# Patient Record
Sex: Female | Born: 1970 | Race: White | Hispanic: No | Marital: Married | Smoking: Former smoker
Health system: Southern US, Community
[De-identification: ages and names within clinical notes are randomized; demographics above are authoritative.]

## PROBLEM LIST (undated history)

## (undated) ENCOUNTER — Ambulatory Visit (HOSPITAL_BASED_OUTPATIENT_CLINIC_OR_DEPARTMENT_OTHER): Payer: Self-pay | Admitting: Gastroenterology

## (undated) DIAGNOSIS — S060XAA Concussion with loss of consciousness status unknown, initial encounter: Secondary | ICD-10-CM

## (undated) DIAGNOSIS — I10 Essential (primary) hypertension: Secondary | ICD-10-CM

## (undated) DIAGNOSIS — F319 Bipolar disorder, unspecified: Secondary | ICD-10-CM

## (undated) DIAGNOSIS — Z8619 Personal history of other infectious and parasitic diseases: Secondary | ICD-10-CM

## (undated) DIAGNOSIS — F32A Depression, unspecified: Secondary | ICD-10-CM

## (undated) DIAGNOSIS — E039 Hypothyroidism, unspecified: Secondary | ICD-10-CM

## (undated) DIAGNOSIS — R112 Nausea with vomiting, unspecified: Secondary | ICD-10-CM

## (undated) DIAGNOSIS — F419 Anxiety disorder, unspecified: Secondary | ICD-10-CM

## (undated) DIAGNOSIS — Z9889 Other specified postprocedural states: Secondary | ICD-10-CM

## (undated) DIAGNOSIS — K5100 Ulcerative (chronic) pancolitis without complications: Secondary | ICD-10-CM

## (undated) DIAGNOSIS — K6289 Other specified diseases of anus and rectum: Secondary | ICD-10-CM

## (undated) DIAGNOSIS — R197 Diarrhea, unspecified: Secondary | ICD-10-CM

## (undated) DIAGNOSIS — K529 Noninfective gastroenteritis and colitis, unspecified: Secondary | ICD-10-CM

## (undated) DIAGNOSIS — K635 Polyp of colon: Secondary | ICD-10-CM

## (undated) HISTORY — DX: Concussion with loss of consciousness status unknown, initial encounter: S06.0XAA

## (undated) HISTORY — PX: PARATHYROIDECTOMY: SHX19

## (undated) HISTORY — PX: DIAGNOSTIC LAPAROSCOPY: SUR761

## (undated) HISTORY — DX: Polyp of colon: K63.5

## (undated) HISTORY — DX: Ulcerative (chronic) pancolitis without complications: K51.00

## (undated) HISTORY — DX: Anxiety disorder, unspecified: F41.9

## (undated) HISTORY — DX: Diarrhea, unspecified: R19.7

## (undated) HISTORY — DX: Other specified diseases of anus and rectum: K62.89

---

## 1997-12-03 ENCOUNTER — Other Ambulatory Visit: Admission: RE | Admit: 1997-12-03 | Discharge: 1997-12-03 | Payer: Self-pay | Admitting: Obstetrics and Gynecology

## 1998-05-22 ENCOUNTER — Emergency Department: Admit: 1998-05-22 | Disposition: A | Discharge status: Home / Self Care | Payer: Self-pay | Source: Ambulatory Visit

## 1998-05-23 ENCOUNTER — Emergency Department: Admit: 1998-05-23 | Disposition: A | Discharge status: Home / Self Care | Payer: Self-pay | Source: Ambulatory Visit

## 1998-06-02 ENCOUNTER — Other Ambulatory Visit: Admission: RE | Admit: 1998-06-02 | Discharge: 1998-06-02 | Payer: Self-pay | Admitting: Obstetrics and Gynecology

## 1999-06-03 ENCOUNTER — Ambulatory Visit
Admission: RE | Admit: 1999-06-03 | Disposition: A | Discharge status: Home / Self Care | Payer: Self-pay | Source: Ambulatory Visit

## 1999-06-03 ENCOUNTER — Other Ambulatory Visit: Admission: RE | Admit: 1999-06-03 | Discharge: 1999-06-03 | Payer: Self-pay | Admitting: Obstetrics and Gynecology

## 1999-07-20 ENCOUNTER — Ambulatory Visit (HOSPITAL_COMMUNITY): Admission: RE | Admit: 1999-07-20 | Discharge: 1999-07-20 | Payer: Self-pay | Admitting: Obstetrics and Gynecology

## 1999-07-20 ENCOUNTER — Encounter: Payer: Self-pay | Admitting: Obstetrics and Gynecology

## 2000-05-04 ENCOUNTER — Ambulatory Visit
Admission: AD | Admit: 2000-05-04 | Disposition: A | Discharge status: Home / Self Care | Payer: Self-pay | Source: Ambulatory Visit

## 2000-05-08 ENCOUNTER — Inpatient Hospital Stay
Admission: AD | Admit: 2000-05-08 | Disposition: A | Discharge status: Home / Self Care | Payer: Self-pay | Source: Ambulatory Visit

## 2000-07-20 ENCOUNTER — Other Ambulatory Visit: Admission: RE | Admit: 2000-07-20 | Discharge: 2000-07-20 | Payer: Self-pay | Admitting: Obstetrics and Gynecology

## 2001-09-06 ENCOUNTER — Other Ambulatory Visit: Admission: RE | Admit: 2001-09-06 | Discharge: 2001-09-06 | Payer: Self-pay | Admitting: Obstetrics and Gynecology

## 2002-03-27 ENCOUNTER — Encounter: Payer: Self-pay | Admitting: Internal Medicine

## 2002-03-27 ENCOUNTER — Encounter: Admission: RE | Admit: 2002-03-27 | Discharge: 2002-03-27 | Payer: Self-pay | Admitting: Internal Medicine

## 2002-09-13 ENCOUNTER — Other Ambulatory Visit: Admission: RE | Admit: 2002-09-13 | Discharge: 2002-09-13 | Payer: Self-pay | Admitting: Obstetrics and Gynecology

## 2002-11-15 ENCOUNTER — Encounter: Admission: RE | Admit: 2002-11-15 | Discharge: 2002-11-15 | Payer: Self-pay | Admitting: Otolaryngology

## 2002-11-15 ENCOUNTER — Encounter: Payer: Self-pay | Admitting: Otolaryngology

## 2002-11-19 ENCOUNTER — Other Ambulatory Visit: Admission: RE | Admit: 2002-11-19 | Discharge: 2002-11-19 | Payer: Self-pay | Admitting: Otolaryngology

## 2002-12-27 ENCOUNTER — Ambulatory Visit (HOSPITAL_COMMUNITY): Admission: RE | Admit: 2002-12-27 | Discharge: 2002-12-27 | Payer: Self-pay | Admitting: Otolaryngology

## 2002-12-27 ENCOUNTER — Ambulatory Visit (HOSPITAL_BASED_OUTPATIENT_CLINIC_OR_DEPARTMENT_OTHER): Admission: RE | Admit: 2002-12-27 | Discharge: 2002-12-27 | Payer: Self-pay | Admitting: Otolaryngology

## 2002-12-27 ENCOUNTER — Encounter (INDEPENDENT_AMBULATORY_CARE_PROVIDER_SITE_OTHER): Payer: Self-pay | Admitting: *Deleted

## 2003-04-02 ENCOUNTER — Other Ambulatory Visit: Admission: RE | Admit: 2003-04-02 | Discharge: 2003-04-02 | Payer: Self-pay | Admitting: Obstetrics and Gynecology

## 2003-07-30 ENCOUNTER — Other Ambulatory Visit: Admission: RE | Admit: 2003-07-30 | Discharge: 2003-07-30 | Payer: Self-pay | Admitting: Gynecology

## 2004-02-21 ENCOUNTER — Inpatient Hospital Stay
Admission: AD | Admit: 2004-02-21 | Disposition: A | Discharge status: Home / Self Care | Payer: Self-pay | Source: Ambulatory Visit

## 2004-02-26 ENCOUNTER — Other Ambulatory Visit: Admission: RE | Admit: 2004-02-26 | Discharge: 2004-02-26 | Payer: Self-pay | Admitting: Obstetrics and Gynecology

## 2004-03-31 ENCOUNTER — Ambulatory Visit (HOSPITAL_COMMUNITY): Admission: RE | Admit: 2004-03-31 | Discharge: 2004-03-31 | Payer: Self-pay | Admitting: Obstetrics and Gynecology

## 2007-04-28 ENCOUNTER — Observation Stay
Admission: EM | Admit: 2007-04-28 | Disposition: A | Discharge status: Home / Self Care | Payer: Self-pay | Source: Ambulatory Visit

## 2007-07-05 ENCOUNTER — Ambulatory Visit
Admission: RE | Admit: 2007-07-05 | Disposition: A | Discharge status: Home / Self Care | Payer: Self-pay | Source: Ambulatory Visit

## 2007-07-12 ENCOUNTER — Ambulatory Visit
Admission: RE | Admit: 2007-07-12 | Disposition: A | Discharge status: Home / Self Care | Payer: Self-pay | Source: Ambulatory Visit | Admitting: Gastroenterology

## 2008-10-02 ENCOUNTER — Emergency Department
Admission: EM | Admit: 2008-10-02 | Disposition: A | Discharge status: Home / Self Care | Payer: Self-pay | Source: Ambulatory Visit

## 2009-07-29 DIAGNOSIS — Z86010 Personal history of colonic polyps: Secondary | ICD-10-CM

## 2009-09-08 ENCOUNTER — Ambulatory Visit
Admission: RE | Admit: 2009-09-08 | Disposition: A | Discharge status: Home / Self Care | Payer: Self-pay | Source: Ambulatory Visit

## 2009-09-16 ENCOUNTER — Encounter (HOSPITAL_COMMUNITY): Admission: RE | Admit: 2009-09-16 | Discharge: 2009-11-18 | Payer: Self-pay | Admitting: Internal Medicine

## 2009-11-06 ENCOUNTER — Ambulatory Visit
Admission: RE | Admit: 2009-11-06 | Disposition: A | Discharge status: Home / Self Care | Payer: Self-pay | Source: Ambulatory Visit | Admitting: Gastroenterology

## 2009-11-30 ENCOUNTER — Observation Stay (HOSPITAL_COMMUNITY): Admission: RE | Admit: 2009-11-30 | Discharge: 2009-12-01 | Payer: Self-pay | Admitting: Surgery

## 2009-11-30 ENCOUNTER — Encounter (INDEPENDENT_AMBULATORY_CARE_PROVIDER_SITE_OTHER): Payer: Self-pay | Admitting: Surgery

## 2010-04-06 ENCOUNTER — Other Ambulatory Visit: Payer: Self-pay | Admitting: Obstetrics and Gynecology

## 2010-05-06 ENCOUNTER — Other Ambulatory Visit (HOSPITAL_COMMUNITY): Payer: Self-pay | Admitting: Surgery

## 2010-05-06 LAB — SURGICAL PCR SCREEN
MRSA, PCR: NEGATIVE
Staphylococcus aureus: NEGATIVE

## 2010-05-06 LAB — COMPREHENSIVE METABOLIC PANEL
Alkaline Phosphatase: 70 U/L (ref 39–117)
BUN: 9 mg/dL (ref 6–23)
Creatinine, Ser: 0.7 mg/dL (ref 0.4–1.2)
Glucose, Bld: 99 mg/dL (ref 70–99)
Potassium: 3.7 mEq/L (ref 3.5–5.1)
Total Bilirubin: 0.5 mg/dL (ref 0.3–1.2)
Total Protein: 7.9 g/dL (ref 6.0–8.3)

## 2010-05-06 LAB — CBC
HCT: 36.8 % (ref 36.0–46.0)
MCH: 28.7 pg (ref 26.0–34.0)
MCV: 83.7 fL (ref 78.0–100.0)
RDW: 13.1 % (ref 11.5–15.5)
WBC: 6 10*3/uL (ref 4.0–10.5)

## 2010-05-06 LAB — CALCIUM: Calcium: 9.8 mg/dL (ref 8.4–10.5)

## 2010-05-06 LAB — PREGNANCY, URINE: Preg Test, Ur: NEGATIVE

## 2010-05-12 ENCOUNTER — Encounter (HOSPITAL_COMMUNITY)
Admission: RE | Admit: 2010-05-12 | Discharge: 2010-05-12 | Disposition: A | Discharge status: Home / Self Care | Payer: BC Managed Care – PPO | Source: Ambulatory Visit | Attending: Surgery | Admitting: Surgery

## 2010-05-12 ENCOUNTER — Ambulatory Visit (HOSPITAL_COMMUNITY): Payer: BC Managed Care – PPO

## 2010-05-12 ENCOUNTER — Ambulatory Visit (HOSPITAL_COMMUNITY): Payer: BC Managed Care – PPO | Attending: Surgery

## 2010-05-12 MED ORDER — TECHNETIUM TC 99M SESTAMIBI - CARDIOLITE
25.0000 | Freq: Once | INTRAVENOUS | Status: AC | PRN
  Administered 2010-05-12: 25 via INTRAVENOUS

## 2010-06-18 ENCOUNTER — Other Ambulatory Visit: Payer: Self-pay | Admitting: Surgery

## 2010-06-18 DIAGNOSIS — E213 Hyperparathyroidism, unspecified: Secondary | ICD-10-CM

## 2010-06-23 ENCOUNTER — Ambulatory Visit
Admission: RE | Admit: 2010-06-23 | Discharge: 2010-06-23 | Disposition: A | Discharge status: Home / Self Care | Payer: BC Managed Care – PPO | Source: Ambulatory Visit | Attending: Surgery | Admitting: Surgery

## 2010-06-23 ENCOUNTER — Other Ambulatory Visit: Payer: Self-pay

## 2010-06-23 DIAGNOSIS — E213 Hyperparathyroidism, unspecified: Secondary | ICD-10-CM

## 2010-06-23 MED ORDER — IOHEXOL 300 MG/ML  SOLN
75.0000 mL | Freq: Once | INTRAMUSCULAR | Status: AC | PRN
  Administered 2010-06-23: 75 mL via INTRAVENOUS

## 2010-07-09 NOTE — Op Note (Signed)
NAMEWILSON, SAMPLE              ACCOUNT NO.:  [id number]   MEDICAL RECORD NO.:  [id number]          PATIENT TYPE:  AMB   LOCATION:  SDC                           FACILITY:  [facility]   PHYSICIAN:  Miguel Aschoff, M.D.       DATE OF BIRTH:  [birth year]   DATE OF PROCEDURE:  03/31/2004  DATE OF DISCHARGE:                                 OPERATIVE REPORT   PREOPERATIVE DIAGNOSIS:  Pelvic pain.   POSTOPERATIVE DIAGNOSES:  1.  Pelvic endometriosis.  2.  Endometrioma of left ovary.  3.  Pelvic adhesions.  4.  Hydrosalpinx of left fallopian tube.   PROCEDURE:  1.  Diagnostic laparoscopy with laser ablation of uterosacral nerves.  2.  Laser ablation of endometriosis.  3.  Drainage of endometrioma.  4.  Chromotubation.   SURGEON:  Miguel Aschoff, M.D.   ANESTHESIA:  General.   COMPLICATIONS:  None.   JUSTIFICATION:  The patient is a 40 year old white female who has had a  history of persistent pelvic pain, increasing dysmenorrhea and dyspareunia  that has been unresponsive to medical therapy.  She presents now to see if  an etiology for her pain can be established.  It was suggested on ultrasound  that the patient may have an endometrioma involving the left ovary.  Informed consent has been obtained.   PROCEDURE:  The patient was taken to the operating room and placed in a  supine position, and general anesthesia was administered without difficulty.  She was then placed in the dorsal lithotomy position and prepped and draped  in the usual sterile fashion.  The bladder was catheterized, and a Kahn  cannula was placed through the cervix and held.  Examination revealed the  uterus to be anterior, and no adnexal masses were noted.  Once this was  done, a small infraumbilical incision was made, a Veress needle was  inserted, and then the abdomen was insufflated with 3 L CO2.  Following  insufflation, the trocar to the laparoscope was placed, followed by the  laparoscope itself, and under direct  visualization a 5 mm port was  established suprapubically.  At this point systematic inspection of the  pelvic organs was carried out.  The anterior bladder peritoneum was  unremarkable.  The left round ligament was unremarkable.  The right round  ligament at its exit point in the inguinal canal showed a defect consistent  with a small hernia.  The bladder peritoneum was unremarkable.  The uterus  appeared to be anterior, normal size and shape.  The right tube was  inspected and was normal along the course.  There was a small paratubal cyst  near the end of the right tube.  The fimbriae were fine and delicate.  The  right ovary was totally within normal limits.  On the left side, however,  the ovary was bound to the left lateral pelvic sidewall by dense adhesions,  which were lysed, and within the parenchyma of the ovary was an  endometrioma, which was opened and drained and then treated with the YAG  laser.  The left tube was draped  around the ovary and involved with  endometriosis.  The distal left tube was clubbed.  Inspection of the cul-de-  sac revealed characteristic implants of endometriosis in the cul-de-sac.  Similar implants of endometriosis were noted on peritoneal surfaces within  the abdomen, some near the right abdominal gutter.  The liver was  unremarkable.  Intestinal surfaces appeared within normal limits.  It was  possible to dissect the left ovary off the left lateral pelvic sidewall.  The endometrioma was opened and drained and then using a YAG laser fiber at  15 watts of power and a GRP6, it was possible to laser-ablate the  endometriosis within the ovary and the implants noted in the cul-de-sac.  Chromotubation was then carried out.  Dye rapidly was noted coming from the  right tube without delay; however, on the left side there was delay  secondary to the clubbing of the left tube.  The tube was distended with the  dye and it was partially obstructed at the fimbriated  end of the tube.  The  appendix was visualized.  It was noted to be within normal limits.  With no  other abnormalities being noted, it was elected to complete the procedure.  The abdomen was irrigated with normal saline.  The Nezhat suction irrigating  device, and the saline was removed.  The CO2 was allowed to escape, all  instruments were removed, then the small incisions were closed using  subcuticular 4-0 Vicryl, and the estimated blood loss was minimal.   The plan is to see the patient back in two or three weeks for discussion of  the intraoperative findings.  The options at that point would be to treat  the patient with six months of Lupron therapy to be followed by an attempt  at rapid conception, versus proceeding with left salpingo-oophorectomy for  the adnexa involved with endometriosis.   The patient will be discharged home.  Her medications for home include Tylox  one every three hours as needed for pain, doxycycline 100 mg twice a day x3  days.  The patient is to call for any problems such as fever, pain or heavy  bleeding.      AR/MEDQ  D:  03/31/2004  T:  03/31/2004  Job:  [id number]   cc:   Leatha Gilding. Mezer, M.D.  [address]  [place]  [place] [address]  Fax: [phone]

## 2010-07-09 NOTE — Op Note (Signed)
NAME:  [name], [name]                        ACCOUNT NO.:  [id number]   MEDICAL RECORD NO.:  [id number]                   PATIENT TYPE:  AMB   LOCATION:  DSC                                  FACILITY:  [facility]   PHYSICIAN:  Lucky Cowboy, M.D.                    DATE OF BIRTH:  1970-10-13   DATE OF PROCEDURE:  12/27/2002  DATE OF DISCHARGE:                                 OPERATIVE REPORT   PREOPERATIVE DIAGNOSIS:  Neck adenopathy.   POSTOPERATIVE DIAGNOSIS:  Neck adenopathy.   PROCEDURE:  Excisional biopsy of right zone 2 neck mass.   SURGEON:  Lucky Cowboy, M.D.   ANESTHESIA:  General endotracheal anesthesia.   ESTIMATED BLOOD LOSS:  None.   COMPLICATIONS:  None.   INDICATIONS FOR PROCEDURE:  The patient is a 40 year old female with a one  and a half month history of swollen glands in the right neck.  These were  felt to be increasing in size.  She has been treated with Augmentin without  resolution.  A CT scan revealed adenopathy in the right neck with the most  concerning being a 2 cm right supraclavicular lymph node.  Fine needle  aspiration was performed of the upper neck right lymph node which revealed  sufficient cellularity for diagnosis.  Due to these concerns and failure to  regress, an excisional biopsy is performed.   FINDINGS:  The patient was noted to have a 2 cm right zone two lymph node  with a palpable submandibular node which was left in place.  This was sent  for lymphoma protocol.   PROCEDURE:  The patient was taken to the operating room and placed on the  table in the supine position.  She was then placed under general  endotracheal anesthesia and the table rotated counter clockwise 90 degrees.  The neck was gently extended using a shoulder roll and turned to the left  side.  The neck was prepped with Betadine and draped in the usual sterile  fashion.  A marking pen was used to outline a 3 cm incision in an actual  skin crease 2 cm below the inferior  border of the mandible overlying the  palpable mass.  The skin was divided using a #15 blade.  The subcutaneous  tissues were divided using Bovie cautery.  The platysma muscle was divided  using Bovie cautery.  Sharp dissection was performed after that time.  The  sternocleidomastoid muscle was identified and divided and the lymph node  likewise identified.  Sharp dissection was performed around and immediately  on the lymph node and blood vessels divided using Bovie cautery.  It was  then placed in saline and sent for lymphoma protocol.  The wound was  copiously irrigated with normal saline.  The subcutaneous tissues and  platysma muscle were  reapproximated in a simple interrupted buried fashion using 4-0 Vicryl.  The  skin  was closed in a simple interrupted fashion using 4-0 Prolene.  Bacitracin ointment was applied, a gauze was then applied and the patient  awakened from anesthesia and taken to the post-anesthesia care unit in  stable condition.  There were no complications.                                               Lucky Cowboy, M.D.    SJ/MEDQ  D:  12/27/2002  T:  12/27/2002  Job:  [id number]   cc:   Geoffry Paradise, M.D.  [address]  [place]  [place] [address]  Fax: [phone]

## 2010-07-28 ENCOUNTER — Other Ambulatory Visit (INDEPENDENT_AMBULATORY_CARE_PROVIDER_SITE_OTHER): Payer: Self-pay | Admitting: Surgery

## 2010-07-28 DIAGNOSIS — E0590 Thyrotoxicosis, unspecified without thyrotoxic crisis or storm: Secondary | ICD-10-CM

## 2010-08-06 ENCOUNTER — Ambulatory Visit (HOSPITAL_COMMUNITY)
Admission: RE | Admit: 2010-08-06 | Discharge: 2010-08-06 | Disposition: A | Discharge status: Home / Self Care | Payer: BC Managed Care – PPO | Source: Ambulatory Visit | Attending: Surgery | Admitting: Surgery

## 2010-08-06 ENCOUNTER — Other Ambulatory Visit (HOSPITAL_COMMUNITY): Payer: Self-pay | Admitting: Interventional Radiology

## 2010-08-06 ENCOUNTER — Other Ambulatory Visit (INDEPENDENT_AMBULATORY_CARE_PROVIDER_SITE_OTHER): Payer: Self-pay | Admitting: Surgery

## 2010-08-06 DIAGNOSIS — E0590 Thyrotoxicosis, unspecified without thyrotoxic crisis or storm: Secondary | ICD-10-CM

## 2010-08-06 DIAGNOSIS — Z01812 Encounter for preprocedural laboratory examination: Secondary | ICD-10-CM | POA: Insufficient documentation

## 2010-08-06 DIAGNOSIS — E213 Hyperparathyroidism, unspecified: Secondary | ICD-10-CM | POA: Insufficient documentation

## 2010-08-06 LAB — COMPREHENSIVE METABOLIC PANEL
CO2: 24 mEq/L (ref 19–32)
Calcium: 10.5 mg/dL (ref 8.4–10.5)
Creatinine, Ser: 0.59 mg/dL (ref 0.50–1.10)
GFR calc Af Amer: >60 mL/min (ref >60)
GFR calc non Af Amer: >60 mL/min (ref >60)
Glucose, Bld: 98 mg/dL (ref 70–99)

## 2010-08-06 LAB — CBC
MCH: 25.1 pg — ABNORMAL LOW (ref 26.0–34.0)
MCV: 77.4 fL — ABNORMAL LOW (ref 78.0–100.0)
Platelets: 201 10*3/uL (ref 150–400)
RDW: 14 % (ref 11.5–15.5)
WBC: 5.7 10*3/uL (ref 4.0–10.5)

## 2010-08-09 LAB — PTH, INTACT AND CALCIUM
Calcium, Total (PTH): 10.6 mg/dL — ABNORMAL HIGH (ref 8.4–10.5)
PTH: 129.8 pg/mL — ABNORMAL HIGH (ref 14.0–72.0)
PTH: 108.5 pg/mL — ABNORMAL HIGH (ref 14.0–72.0)
PTH: 126.7 pg/mL — ABNORMAL HIGH (ref 14.0–72.0)
PTH: 58.1 pg/mL (ref 14.0–72.0)
PTH: 82.5 pg/mL — ABNORMAL HIGH (ref 14.0–72.0)
PTH: 75.6 pg/mL — ABNORMAL HIGH (ref 14.0–72.0)
PTH: 93.6 pg/mL — ABNORMAL HIGH (ref 14.0–72.0)

## 2010-09-08 ENCOUNTER — Telehealth (INDEPENDENT_AMBULATORY_CARE_PROVIDER_SITE_OTHER): Payer: Self-pay | Admitting: Surgery

## 2010-09-08 DIAGNOSIS — E210 Primary hyperparathyroidism: Secondary | ICD-10-CM

## 2010-09-08 NOTE — Telephone Encounter (Signed)
See note. TMG

## 2010-09-14 ENCOUNTER — Telehealth (INDEPENDENT_AMBULATORY_CARE_PROVIDER_SITE_OTHER): Payer: Self-pay | Admitting: General Surgery

## 2010-09-14 NOTE — Telephone Encounter (Signed)
Wilkie Aye with [facility] called re: patient's MRI Chest scheduled 09/16/10. Wilkie Aye states insurance has denied to cover this test. She is asking if we should cancel. Please address with Dr Gerrit Friends.

## 2010-09-15 ENCOUNTER — Telehealth (INDEPENDENT_AMBULATORY_CARE_PROVIDER_SITE_OTHER): Payer: Self-pay

## 2010-09-16 ENCOUNTER — Telehealth (INDEPENDENT_AMBULATORY_CARE_PROVIDER_SITE_OTHER): Payer: Self-pay

## 2010-09-16 NOTE — Telephone Encounter (Signed)
[name] with [facility] called re: MRI insurance auth., please call them and let them know if this is an elective procedure and if so can they cancel it?

## 2010-09-16 NOTE — Telephone Encounter (Signed)
Peer to Peer review requested for MRI of Chest by [facility] [phone] opt #2. Per pt need to be reviewed before 8/3.  Thanks

## 2010-09-16 NOTE — Telephone Encounter (Signed)
Patient called advised to r/s appointment out for 2 weeks after peer meeting.

## 2010-09-30 ENCOUNTER — Telehealth (INDEPENDENT_AMBULATORY_CARE_PROVIDER_SITE_OTHER): Payer: Self-pay

## 2010-09-30 ENCOUNTER — Inpatient Hospital Stay: Admission: RE | Admit: 2010-09-30 | Payer: BC Managed Care – PPO | Source: Ambulatory Visit

## 2010-09-30 ENCOUNTER — Other Ambulatory Visit: Payer: BC Managed Care – PPO

## 2010-09-30 NOTE — Telephone Encounter (Signed)
Patient's mri of chest  order has expired.  Is it still possible to make the call for the peer to peer. Or what is the next step.

## 2010-09-30 NOTE — Telephone Encounter (Signed)
[name]: Re-order the MRI scan as before.  Note that I have discussed this with the radiologist.  Also, make sure they know the diagnosis is primary hyperparathyroidism, and that we are looking for an ectopic parathyroid gland.  If they don't accept this order, then I will call them next week. TMG

## 2010-10-08 ENCOUNTER — Telehealth (INDEPENDENT_AMBULATORY_CARE_PROVIDER_SITE_OTHER): Payer: Self-pay

## 2010-10-08 NOTE — Telephone Encounter (Signed)
Insurance approved both scans at [employer]. RMP

## 2010-10-12 ENCOUNTER — Ambulatory Visit
Admission: RE | Admit: 2010-10-12 | Discharge: 2010-10-12 | Disposition: A | Discharge status: Home / Self Care | Payer: BC Managed Care – PPO | Source: Ambulatory Visit | Attending: Surgery | Admitting: Surgery

## 2010-10-12 MED ORDER — GADOBENATE DIMEGLUMINE 529 MG/ML IV SOLN
18.0000 mL | Freq: Once | INTRAVENOUS | Status: AC | PRN
  Administered 2010-10-12: 18 mL via INTRAVENOUS

## 2010-10-13 ENCOUNTER — Telehealth (INDEPENDENT_AMBULATORY_CARE_PROVIDER_SITE_OTHER): Payer: Self-pay

## 2010-10-13 NOTE — Telephone Encounter (Signed)
MRI results given to patient.  

## 2010-10-27 ENCOUNTER — Telehealth (INDEPENDENT_AMBULATORY_CARE_PROVIDER_SITE_OTHER): Payer: Self-pay | Admitting: Surgery

## 2010-10-27 NOTE — Telephone Encounter (Signed)
Telephone conversation today with the patient regarding all of her recent test results. I have reviewed her radiology studies with Dr. Carey Bullocks. I have also discussed her case at length with Dr. Geoffry Paradise.  I explained to the patient that her studies do not identify a parathyroid adenoma in either the neck or the chest. I do not wish to perform exploratory surgery without a definite target.  After discussion with her primary physician, we would like to refer her to a tertiary care institution for endocrine surgery consultation. I have recommended Dr. Madelyn Brunner at [facility]. We will forward records to the physician and arrange for consultation in the near future.  The patient is in agreement with this plan.

## 2010-10-28 ENCOUNTER — Telehealth (INDEPENDENT_AMBULATORY_CARE_PROVIDER_SITE_OTHER): Payer: Self-pay

## 2010-10-28 ENCOUNTER — Other Ambulatory Visit (INDEPENDENT_AMBULATORY_CARE_PROVIDER_SITE_OTHER): Payer: Self-pay | Admitting: Surgery

## 2010-10-28 DIAGNOSIS — E210 Primary hyperparathyroidism: Secondary | ICD-10-CM

## 2010-10-28 NOTE — Telephone Encounter (Signed)
Patient aware of appointment with Dr. Lady Gary on 11/05/2010 at 9:30am.

## 2010-11-26 DIAGNOSIS — E063 Autoimmune thyroiditis: Secondary | ICD-10-CM | POA: Insufficient documentation

## 2011-07-08 ENCOUNTER — Other Ambulatory Visit: Payer: Self-pay | Admitting: Obstetrics and Gynecology

## 2011-07-08 DIAGNOSIS — N63 Unspecified lump in unspecified breast: Secondary | ICD-10-CM

## 2011-07-14 ENCOUNTER — Ambulatory Visit
Admission: RE | Admit: 2011-07-14 | Discharge: 2011-07-14 | Disposition: A | Discharge status: Home / Self Care | Payer: BC Managed Care – PPO | Source: Ambulatory Visit | Attending: Obstetrics and Gynecology | Admitting: Obstetrics and Gynecology

## 2011-07-14 DIAGNOSIS — N63 Unspecified lump in unspecified breast: Secondary | ICD-10-CM

## 2012-08-07 ENCOUNTER — Emergency Department
Admission: EM | Admit: 2012-08-07 | Discharge: 2012-08-07 | Disposition: A | Discharge status: Home / Self Care | Payer: BC Managed Care – PPO

## 2012-08-07 ENCOUNTER — Emergency Department: Payer: BC Managed Care – PPO

## 2012-08-07 DIAGNOSIS — K921 Melena: Secondary | ICD-10-CM

## 2012-08-07 DIAGNOSIS — M545 Low back pain, unspecified: Secondary | ICD-10-CM | POA: Insufficient documentation

## 2012-08-07 HISTORY — DX: Melena: K92.1

## 2012-08-07 HISTORY — DX: Anxiety disorder, unspecified: F41.9

## 2012-08-07 LAB — URINALYSIS WITH CULTURE REFLEX
Bilirubin, UA: NEGATIVE
Blood, UA: NEGATIVE
Glucose, UA: NEGATIVE mg/dL
Ketones UA: NEGATIVE mg/dL
Leukocyte Esterase, UA: NEGATIVE Leu/uL
Nitrite, UA: NEGATIVE
RBC, UA: NONE SEEN /hpf
Urine Protein: NEGATIVE mg/dL
Urine Specific Gravity: 1.01 (ref 1.005–1.030)
Urobilinogen, UA: 0.2 mg/dL
WBC, UA: NONE SEEN /hpf
pH, Urine: 7.5 pH (ref 5.0–8.0)

## 2012-08-07 LAB — VH URINE DRUG SCREEN-MEDTOX
Amphetamine: NEGATIVE
Barbiturates: NEGATIVE
Cannabinoids: NEGATIVE
Cocaine: NEGATIVE
Methamphetamine: NEGATIVE
Opiates: NEGATIVE
Phencyclidine: NEGATIVE
Propoxyphene: NEGATIVE
Urine Benzodiazepines: NEGATIVE
Urine Methadone Screen: NEGATIVE
Urine Oxycodone: NEGATIVE
Urine Tricyclics: NEGATIVE

## 2012-08-07 MED ORDER — CYCLOBENZAPRINE HCL 10 MG PO TABS
10.0000 mg | ORAL_TABLET | Freq: Three times a day (TID) | ORAL | Status: DC | PRN
Start: 2012-08-07 — End: 2014-07-30

## 2012-08-07 MED ORDER — ORPHENADRINE CITRATE 30 MG/ML IJ SOLN
INTRAMUSCULAR | Status: AC
Start: 2012-08-07 — End: ?
  Filled 2012-08-07: qty 2

## 2012-08-07 MED ORDER — KETOROLAC TROMETHAMINE 60 MG/2ML IM SOLN
60.00 mg | Freq: Once | INTRAMUSCULAR | Status: AC
Start: 2012-08-07 — End: 2012-08-07
  Administered 2012-08-07: 60 mg via INTRAMUSCULAR

## 2012-08-07 MED ORDER — ORPHENADRINE CITRATE 30 MG/ML IJ SOLN
60.0000 mg | Freq: Once | INTRAMUSCULAR | Status: AC
Start: 2012-08-07 — End: 2012-08-07
  Administered 2012-08-07: 60 mg via INTRAMUSCULAR

## 2012-08-07 MED ORDER — DICLOFENAC SODIUM 75 MG PO TBEC
DELAYED_RELEASE_TABLET | ORAL | Status: DC
Start: 2012-08-07 — End: 2014-07-30

## 2012-08-07 MED ORDER — KETOROLAC TROMETHAMINE 60 MG/2ML IM SOLN
INTRAMUSCULAR | Status: AC
Start: 2012-08-07 — End: ?
  Filled 2012-08-07: qty 2

## 2012-08-07 NOTE — ED Provider Notes (Addendum)
Physician/Midlevel provider first contact with patient: 08/07/12 2016         History     Chief Complaint   Patient presents with   . Back Pain     lower right side     Patient is a 42 y.o. female presenting with back pain. The history is provided by the patient. No language interpreter was used.   Back Pain   This is a new problem. The current episode started 6 to 12 hours ago. The problem occurs constantly. The problem has been gradually worsening. The pain is associated with lifting heavy objects. The pain is present in the lumbar spine. The quality of the pain is described as aching and shooting. The pain does not radiate. The pain is at a severity of 9/10. The pain is severe. The symptoms are aggravated by bending, twisting and certain positions. The pain is the same all the time. Pertinent negatives include no numbness, no headaches, no abdominal pain, no abdominal swelling, no bowel incontinence, no perianal numbness, no bladder incontinence, no leg pain, no tingling and no weakness.     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 Bloody stools 08/07/2012   . Anxiety        History reviewed. No pertinent past surgical history.    History reviewed. No pertinent family history.    Social  History   Substance Use Topics   . Smoking status: Never Smoker    . Smokeless tobacco: Never Used   . Alcohol Use: 3.6 oz/week     6 Cans of beer per week       .     Allergies   Allergen Reactions   . Codeine Nausea And Vomiting       Current/Home Medications    No medications on file        Review of Systems   Constitutional: Negative.    HENT: Negative.    Eyes: Negative.    Respiratory: Negative.    Cardiovascular: Negative.    Gastrointestinal: Negative.  Negative for abdominal pain  and bowel incontinence.   Genitourinary: Negative.  Negative for bladder incontinence.   Musculoskeletal: Positive for back pain.   Skin: Negative.    Neurological: Negative.  Negative for tingling, weakness, numbness and headaches.   Hematological: Negative.    Psychiatric/Behavioral: Negative.        Physical Exam    BP 151/114  Pulse 84  Temp 99.1 F (37.3 C)  Resp 20  Ht 1.676 m  Wt 79.379 kg  BMI 28.26 kg/m2  SpO2 99%  LMP 07/30/2012    Physical Exam   Constitutional: She is oriented to person, place, and time. She appears well-nourished.   HENT:   Head: Normocephalic and atraumatic.   Nose: Nose normal.   Mouth/Throat: Oropharynx is clear and moist.   Eyes: Conjunctivae normal and EOM are normal. Pupils are equal, round, and reactive to light.   Neck: Normal range of motion. Neck supple. No JVD present.   Cardiovascular: Normal rate, regular rhythm, normal heart sounds and intact distal pulses.    Pulmonary/Chest: Effort normal and breath sounds normal. She has no rales.   Abdominal: Soft. Bowel sounds are normal.  There is no tenderness.   Musculoskeletal: She exhibits no edema.        Tenderness paraspinal area L spine , positive staright leg test rt leg   Lymphadenopathy:     She has no cervical adenopathy.   Neurological: She is alert and oriented to person, place, and time. She has normal reflexes. She displays normal reflexes. No cranial nerve deficit. She exhibits normal muscle tone.   Skin: Skin is warm. No rash noted.   Psychiatric: She has a normal mood and affect. Her behavior is normal. Judgment and thought content normal.       MDM and ED Course     ED Medication Orders      Start     Status Ordering Provider    08/07/12 2024   orphenadrine (NORFLEX) injection 60 mg   Once in ED      Route: Intramuscular  Ordered Dose: 60 mg         Last MAR action:  Given Angel [name] A    08/07/12 2024   ketorolac (TORADOL) injection 60 mg   Once      Route: Intramuscular  Ordered Dose: 60 mg          Last MAR action:  Given Angel [name] A                 MDM      Procedures    Clinical Impression & Disposition     Clinical Impression  Final diagnoses:   Low back pain        ED Disposition     Discharge Angel [name] discharge to home/self care.    Condition at discharge: Good             New Prescriptions    CYCLOBENZAPRINE (FLEXERIL) 10 MG TABLET    Take 1 tablet (10 mg total) by mouth 3 (three) times daily as needed for Muscle spasms.    DICLOFENAC (VOLTAREN) 75 MG EC TABLET    One Po with food BID               Doris Cheadle, MD  08/07/12 2032    Doris Cheadle, MD  08/07/12 2200

## 2012-08-07 NOTE — ED Notes (Addendum)
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 States pain is worst with any activity.

## 2012-08-10 ENCOUNTER — Other Ambulatory Visit: Payer: Self-pay | Admitting: Obstetrics and Gynecology

## 2012-08-14 ENCOUNTER — Other Ambulatory Visit (INDEPENDENT_AMBULATORY_CARE_PROVIDER_SITE_OTHER): Payer: Self-pay | Admitting: Family Medicine

## 2012-08-17 ENCOUNTER — Other Ambulatory Visit: Payer: Self-pay | Admitting: Gastroenterology

## 2012-08-17 ENCOUNTER — Ambulatory Visit (INDEPENDENT_AMBULATORY_CARE_PROVIDER_SITE_OTHER): Payer: BC Managed Care – PPO | Attending: Physician Assistant

## 2012-08-17 ENCOUNTER — Other Ambulatory Visit
Admission: RE | Admit: 2012-08-17 | Discharge: 2012-08-17 | Disposition: A | Discharge status: Home / Self Care | Payer: BC Managed Care – PPO | Source: Ambulatory Visit | Attending: Gastroenterology | Admitting: Gastroenterology

## 2012-08-18 ENCOUNTER — Ambulatory Visit (INDEPENDENT_AMBULATORY_CARE_PROVIDER_SITE_OTHER)
Admission: RE | Admit: 2012-08-18 | Discharge: 2012-08-18 | Disposition: A | Discharge status: Home / Self Care | Payer: BC Managed Care – PPO | Source: Ambulatory Visit | Attending: Family Medicine | Admitting: Family Medicine

## 2012-08-20 ENCOUNTER — Encounter (HOSPITAL_COMMUNITY): Payer: Self-pay

## 2012-08-27 ENCOUNTER — Ambulatory Visit: Payer: BC Managed Care – PPO | Attending: Physician Assistant

## 2012-08-27 DIAGNOSIS — IMO0001 Reserved for inherently not codable concepts without codable children: Secondary | ICD-10-CM | POA: Insufficient documentation

## 2012-08-27 DIAGNOSIS — M519 Unspecified thoracic, thoracolumbar and lumbosacral intervertebral disc disorder: Secondary | ICD-10-CM | POA: Insufficient documentation

## 2012-08-27 DIAGNOSIS — M546 Pain in thoracic spine: Secondary | ICD-10-CM | POA: Insufficient documentation

## 2012-08-29 ENCOUNTER — Encounter (HOSPITAL_COMMUNITY): Payer: Self-pay

## 2012-08-29 ENCOUNTER — Encounter (HOSPITAL_COMMUNITY)
Admission: RE | Admit: 2012-08-29 | Discharge: 2012-08-29 | Disposition: A | Discharge status: Home / Self Care | Payer: BC Managed Care – PPO | Source: Ambulatory Visit | Attending: Obstetrics and Gynecology | Admitting: Obstetrics and Gynecology

## 2012-08-29 ENCOUNTER — Other Ambulatory Visit: Payer: Self-pay | Admitting: Obstetrics and Gynecology

## 2012-08-29 HISTORY — DX: Other specified postprocedural states: Z98.890

## 2012-08-29 HISTORY — DX: Personal history of other infectious and parasitic diseases: Z86.19

## 2012-08-29 HISTORY — DX: Essential (primary) hypertension: I10

## 2012-08-29 HISTORY — DX: Other specified postprocedural states: R11.2

## 2012-08-29 HISTORY — DX: Hypothyroidism, unspecified: E03.9

## 2012-08-29 LAB — CBC
HCT: 38 % (ref 36.0–46.0)
Hemoglobin: 13.2 g/dL (ref 12.0–15.0)
MCH: 29.3 pg (ref 26.0–34.0)
MCHC: 34.7 g/dL (ref 30.0–36.0)
RDW: 13.3 % (ref 11.5–15.5)

## 2012-08-29 LAB — BASIC METABOLIC PANEL
BUN: 8 mg/dL (ref 6–23)
Calcium: 9.4 mg/dL (ref 8.4–10.5)
GFR calc Af Amer: >90 mL/min (ref >90)
GFR calc non Af Amer: >90 mL/min (ref >90)
Glucose, Bld: 78 mg/dL (ref 70–99)
Sodium: 138 mEq/L (ref 135–145)

## 2012-08-29 MED ORDER — NALBUPHINE SYRINGE 5 MG/0.5 ML
INJECTION | INTRAMUSCULAR | Status: AC
  Filled 2012-08-29: qty 1

## 2012-08-29 NOTE — Patient Instructions (Addendum)
20 [name]  08/29/2012   Your procedure is scheduled on:  09/04/12  Enter through the Main Entrance of [facility] at 6 AM.  Pick up the phone at the desk and dial [phone].   Call this number if you have problems the morning of surgery: [phone]   Remember:   Do not eat food:After Midnight.  Do not drink clear liquids: After Midnight.  Take these medicines the morning of surgery with A SIP OF WATER: none   Do not wear jewelry, make-up or nail polish.  Do not wear lotions, powders, or perfumes. You may wear deodorant.  Do not shave 48 hours prior to surgery.  Do not bring valuables to the hospital.  [facility] is not responsible                  for any belongings or valuables brought to the hospital.  Contacts, dentures or bridgework may not be worn into surgery.  Leave suitcase in the car. After surgery it may be brought to your room.  For patients admitted to the hospital, checkout time is 11:00 AM the day of                discharge.   Patients discharged the day of surgery will not be allowed to drive                   home.  Name and phone number of your driver: NA  Special Instructions: Shower using CHG 2 nights before surgery and the night before surgery.  If you shower the day of surgery use CHG.  Use special wash - you have one bottle of CHG for all showers.  You should use approximately 1/3 of the bottle for each shower.   Please read over the following fact sheets that you were given: Surgical Site Infection Prevention

## 2012-09-03 NOTE — H&P (Signed)
[name] is an 42 y.o. female who presented to the office reporting that her husband was feeling something different during intercourse. On examination she was noted to have prolapse of her cervix and uterus to almost the introitus and it was this structure was being encountered during intercourse. She has requested that the prolapse be treated but in addition she has a history of endometriosis with  Prior laparoscopy and she will be laparoscoped at this time to see if there is adnexal pathology that would also need to be assessed and treated.  Pertinent Gynecological History: Menses: flow is light Contraception: IUD DES exposure: denies Blood transfusions: none Sexually transmitted diseases: no past history Previous GYN Procedures: laparoscopy   Last pap: normal Date: 08/29/2012    Menstrual History:  No LMP recorded.    Past Medical History  Diagnosis Date  . PONV (postoperative nausea and vomiting)   . Hypertension   . Hypothyroidism   . Hx of Lyme disease     recently, tx x2 weeks with antibiotic    Past Surgical History  Procedure Laterality Date  . Parathyroidectomy    . Diagnostic laparoscopy      No family history on file.  Social History:  reports that she has quit smoking. She does not have any smokeless tobacco history on file. She reports that  drinks alcohol. She reports that she does not use illicit drugs.  Allergies:  Allergies  Allergen Reactions  . Neosporin (Neomycin-Bacitracin Zn-Polymyx) Itching and Rash    No prescriptions prior to admission    ROS  Respiratory: no cough or SOB GI: No nausea, vomiting, constipation or diarrhea GU: No dysuria, frequency or urgency Gyn: No irregular bleeding, or discharge or itch   There were no vitals taken for this visit. Physical Exam  Afebrile V/S stable Head; Normocephalic and atraumatic Eyes: PERRLA Neck: supple, no JVD no thyromegaly Chest: Clear to P&A Heart: regular rhythm no murmur or  gallop Abdomen: Soft without enlargement of the liver, kidneys, or spleen Pelvic Exam:   External genitalia: within normal limits   BUS: Within normal limits   Vagina; with 2nd degree cystocele   Cervix: Without gross lesion non tender   Uterus: Anterior normal size non tender with plus 2 decensus   Adnexa: no masses felt, non tender   No results found for this or any previous visit (from the past 24 hour(s)).  No results found.  Impression: Pelvic relaxation with descensus and cystocele, dyspareunia  Plan: LAVH possible BSO, Possible TAH BSO and anterior repair  The risks and benefits have been discussed with the patient and informed consent has been obtained.  Estuardo Frisbee 09/03/2012, 10:48 AM

## 2012-09-04 ENCOUNTER — Encounter (HOSPITAL_COMMUNITY)
Admission: RE | Disposition: A | Discharge status: Home / Self Care | Payer: Self-pay | Source: Ambulatory Visit | Attending: Obstetrics and Gynecology

## 2012-09-04 ENCOUNTER — Encounter (HOSPITAL_COMMUNITY): Payer: Self-pay | Admitting: Anesthesiology

## 2012-09-04 ENCOUNTER — Ambulatory Visit (HOSPITAL_COMMUNITY): Payer: BC Managed Care – PPO | Admitting: Anesthesiology

## 2012-09-04 ENCOUNTER — Observation Stay (HOSPITAL_COMMUNITY)
Admission: RE | Admit: 2012-09-04 | Discharge: 2012-09-05 | Disposition: A | Discharge status: Home / Self Care | Payer: BC Managed Care – PPO | Source: Ambulatory Visit | Attending: Obstetrics and Gynecology | Admitting: Obstetrics and Gynecology

## 2012-09-04 ENCOUNTER — Encounter (HOSPITAL_COMMUNITY): Payer: Self-pay

## 2012-09-04 DIAGNOSIS — N803 Endometriosis of pelvic peritoneum, unspecified: Secondary | ICD-10-CM | POA: Insufficient documentation

## 2012-09-04 DIAGNOSIS — N801 Endometriosis of ovary: Secondary | ICD-10-CM | POA: Insufficient documentation

## 2012-09-04 DIAGNOSIS — N83209 Unspecified ovarian cyst, unspecified side: Secondary | ICD-10-CM | POA: Insufficient documentation

## 2012-09-04 DIAGNOSIS — N80109 Endometriosis of ovary, unspecified side, unspecified depth: Secondary | ICD-10-CM | POA: Insufficient documentation

## 2012-09-04 DIAGNOSIS — IMO0002 Reserved for concepts with insufficient information to code with codable children: Secondary | ICD-10-CM | POA: Insufficient documentation

## 2012-09-04 DIAGNOSIS — N814 Uterovaginal prolapse, unspecified: Principal | ICD-10-CM | POA: Insufficient documentation

## 2012-09-04 HISTORY — PX: LAPAROSCOPIC ASSISTED VAGINAL HYSTERECTOMY: SHX5398

## 2012-09-04 HISTORY — PX: SALPINGOOPHORECTOMY: SHX82

## 2012-09-04 LAB — CBC
HCT: 32 % — ABNORMAL LOW (ref 36.0–46.0)
Hemoglobin: 11.2 g/dL — ABNORMAL LOW (ref 12.0–15.0)
MCH: 29.6 pg (ref 26.0–34.0)
MCHC: 35 g/dL (ref 30.0–36.0)
MCV: 84.4 fL (ref 78.0–100.0)
Platelets: 165 10*3/uL (ref 150–400)
RBC: 3.79 MIL/uL — ABNORMAL LOW (ref 3.87–5.11)
RDW: 13.1 % (ref 11.5–15.5)
WBC: 8.1 10*3/uL (ref 4.0–10.5)

## 2012-09-04 SURGERY — HYSTERECTOMY, VAGINAL, LAPAROSCOPY-ASSISTED
Anesthesia: General | Site: Abdomen | Wound class: Clean Contaminated

## 2012-09-04 MED ORDER — DEXAMETHASONE SODIUM PHOSPHATE 10 MG/ML IJ SOLN
INTRAMUSCULAR | Status: AC
  Filled 2012-09-04: qty 1

## 2012-09-04 MED ORDER — GLYCOPYRROLATE 0.2 MG/ML IJ SOLN
INTRAMUSCULAR | Status: DC | PRN
  Administered 2012-09-04: .4 mg via INTRAVENOUS

## 2012-09-04 MED ORDER — SCOPOLAMINE 1 MG/3DAYS TD PT72
MEDICATED_PATCH | TRANSDERMAL | Status: AC
  Administered 2012-09-04: 1.5 mg via TRANSDERMAL
  Filled 2012-09-04: qty 1

## 2012-09-04 MED ORDER — ROCURONIUM BROMIDE 100 MG/10ML IV SOLN
INTRAVENOUS | Status: DC | PRN
  Administered 2012-09-04: 10 mg via INTRAVENOUS
  Administered 2012-09-04: 40 mg via INTRAVENOUS

## 2012-09-04 MED ORDER — INDIGOTINDISULFONATE SODIUM 8 MG/ML IJ SOLN
INTRAMUSCULAR | Status: AC
  Filled 2012-09-04: qty 5

## 2012-09-04 MED ORDER — NALOXONE HCL 0.4 MG/ML IJ SOLN: 0.4000 mg | INTRAMUSCULAR | Status: DC | PRN

## 2012-09-04 MED ORDER — MENTHOL 3 MG MT LOZG: 1.0000 | LOZENGE | OROMUCOSAL | Status: DC | PRN

## 2012-09-04 MED ORDER — PROPOFOL 10 MG/ML IV EMUL
INTRAVENOUS | Status: AC
  Filled 2012-09-04: qty 20

## 2012-09-04 MED ORDER — LIDOCAINE HCL (CARDIAC) 20 MG/ML IV SOLN
INTRAVENOUS | Status: DC | PRN
  Administered 2012-09-04: 50 mg via INTRAVENOUS

## 2012-09-04 MED ORDER — LACTATED RINGERS IR SOLN
Status: DC | PRN
  Administered 2012-09-04: 3000 mL

## 2012-09-04 MED ORDER — HYDROMORPHONE HCL PF 1 MG/ML IJ SOLN: 0.2500 mg | INTRAMUSCULAR | Status: DC | PRN

## 2012-09-04 MED ORDER — MIDAZOLAM HCL 5 MG/5ML IJ SOLN
INTRAMUSCULAR | Status: DC | PRN
  Administered 2012-09-04: 2 mg via INTRAVENOUS

## 2012-09-04 MED ORDER — HYDROMORPHONE HCL PF 1 MG/ML IJ SOLN
INTRAMUSCULAR | Status: DC | PRN
  Administered 2012-09-04 (×2): 0.5 mg via INTRAVENOUS

## 2012-09-04 MED ORDER — CEFAZOLIN SODIUM-DEXTROSE 2-3 GM-% IV SOLR
2.0000 g | Freq: Three times a day (TID) | INTRAVENOUS | Status: AC
  Administered 2012-09-05: 2 g via INTRAVENOUS
  Filled 2012-09-04: qty 50

## 2012-09-04 MED ORDER — CEFAZOLIN SODIUM-DEXTROSE 2-3 GM-% IV SOLR
2.0000 g | Freq: Once | INTRAVENOUS | Status: AC
  Administered 2012-09-04: 2 g via INTRAVENOUS

## 2012-09-04 MED ORDER — ONDANSETRON HCL 4 MG/2ML IJ SOLN: 4.0000 mg | Freq: Four times a day (QID) | INTRAMUSCULAR | Status: DC | PRN

## 2012-09-04 MED ORDER — FENTANYL CITRATE 0.05 MG/ML IJ SOLN
INTRAMUSCULAR | Status: DC | PRN
  Administered 2012-09-04 (×2): 100 ug via INTRAVENOUS

## 2012-09-04 MED ORDER — OXYCODONE-ACETAMINOPHEN 5-325 MG PO TABS
1.0000 | ORAL_TABLET | ORAL | Status: DC | PRN
  Administered 2012-09-05 (×3): 1 via ORAL
  Filled 2012-09-04 (×3): qty 1

## 2012-09-04 MED ORDER — PROPOFOL 10 MG/ML IV BOLUS
INTRAVENOUS | Status: DC | PRN
  Administered 2012-09-04: 20 mg via INTRAVENOUS
  Administered 2012-09-04: 180 mg via INTRAVENOUS

## 2012-09-04 MED ORDER — BUPIVACAINE HCL (PF) 0.25 % IJ SOLN
INTRAMUSCULAR | Status: DC | PRN
  Administered 2012-09-04: 9 mL

## 2012-09-04 MED ORDER — SODIUM CHLORIDE 0.9 % IJ SOLN: 9.0000 mL | INTRAMUSCULAR | Status: DC | PRN

## 2012-09-04 MED ORDER — LACTATED RINGERS IV SOLN
INTRAVENOUS | Status: DC
  Administered 2012-09-04 – 2012-09-05 (×2): via INTRAVENOUS

## 2012-09-04 MED ORDER — ONDANSETRON HCL 4 MG/2ML IJ SOLN
INTRAMUSCULAR | Status: AC
  Filled 2012-09-04: qty 2

## 2012-09-04 MED ORDER — ONDANSETRON HCL 4 MG/2ML IJ SOLN
INTRAMUSCULAR | Status: DC | PRN
  Administered 2012-09-04: 4 mg via INTRAVENOUS

## 2012-09-04 MED ORDER — CEFAZOLIN SODIUM 1-5 GM-% IV SOLN
1.0000 g | Freq: Once | INTRAVENOUS | Status: AC
  Administered 2012-09-04 (×2): 1 g via INTRAVENOUS
  Filled 2012-09-04: qty 50

## 2012-09-04 MED ORDER — LIDOCAINE-EPINEPHRINE 1 %-1:100000 IJ SOLN
INTRAMUSCULAR | Status: DC | PRN
  Administered 2012-09-04: 7 mL

## 2012-09-04 MED ORDER — NEOSTIGMINE METHYLSULFATE 1 MG/ML IJ SOLN
INTRAMUSCULAR | Status: AC
  Filled 2012-09-04: qty 1

## 2012-09-04 MED ORDER — MORPHINE SULFATE (PF) 1 MG/ML IV SOLN
INTRAVENOUS | Status: DC
  Administered 2012-09-04: 11:00:00 via INTRAVENOUS
  Administered 2012-09-04: 1.5 mg via INTRAVENOUS
  Administered 2012-09-04: 4.5 mg via INTRAVENOUS
  Filled 2012-09-04: qty 25

## 2012-09-04 MED ORDER — ROCURONIUM BROMIDE 50 MG/5ML IV SOLN
INTRAVENOUS | Status: AC
  Filled 2012-09-04: qty 1

## 2012-09-04 MED ORDER — SCOPOLAMINE 1 MG/3DAYS TD PT72: 1.0000 | MEDICATED_PATCH | TRANSDERMAL | Status: DC

## 2012-09-04 MED ORDER — HYDROMORPHONE HCL PF 1 MG/ML IJ SOLN
INTRAMUSCULAR | Status: AC
  Filled 2012-09-04: qty 1

## 2012-09-04 MED ORDER — DIPHENHYDRAMINE HCL 12.5 MG/5ML PO ELIX: 12.5000 mg | ORAL_SOLUTION | Freq: Four times a day (QID) | ORAL | Status: DC | PRN

## 2012-09-04 MED ORDER — NEOSTIGMINE METHYLSULFATE 1 MG/ML IJ SOLN
INTRAMUSCULAR | Status: DC | PRN
  Administered 2012-09-04: 2.5 mg via INTRAVENOUS

## 2012-09-04 MED ORDER — FENTANYL CITRATE 0.05 MG/ML IJ SOLN
INTRAMUSCULAR | Status: AC
  Filled 2012-09-04: qty 5

## 2012-09-04 MED ORDER — KETOROLAC TROMETHAMINE 30 MG/ML IJ SOLN
INTRAMUSCULAR | Status: AC
  Filled 2012-09-04: qty 1

## 2012-09-04 MED ORDER — CEFAZOLIN SODIUM 1-5 GM-% IV SOLN
1.0000 g | Freq: Three times a day (TID) | INTRAVENOUS | Status: DC
  Filled 2012-09-04 (×3): qty 50

## 2012-09-04 MED ORDER — LIDOCAINE HCL (CARDIAC) 20 MG/ML IV SOLN
INTRAVENOUS | Status: AC
  Filled 2012-09-04: qty 5

## 2012-09-04 MED ORDER — LACTATED RINGERS IV SOLN
INTRAVENOUS | Status: DC
  Administered 2012-09-04 (×3): via INTRAVENOUS

## 2012-09-04 MED ORDER — CEFAZOLIN SODIUM-DEXTROSE 2-3 GM-% IV SOLR
2.0000 g | Freq: Three times a day (TID) | INTRAVENOUS | Status: DC
  Administered 2012-09-04 (×2): 1 g via INTRAVENOUS

## 2012-09-04 MED ORDER — CEFAZOLIN SODIUM-DEXTROSE 2-3 GM-% IV SOLR
INTRAVENOUS | Status: AC
  Filled 2012-09-04: qty 50

## 2012-09-04 MED ORDER — DEXAMETHASONE SODIUM PHOSPHATE 10 MG/ML IJ SOLN
INTRAMUSCULAR | Status: DC | PRN
  Administered 2012-09-04: 10 mg via INTRAVENOUS

## 2012-09-04 MED ORDER — MIDAZOLAM HCL 2 MG/2ML IJ SOLN
INTRAMUSCULAR | Status: AC
  Filled 2012-09-04: qty 2

## 2012-09-04 MED ORDER — GLYCOPYRROLATE 0.2 MG/ML IJ SOLN
INTRAMUSCULAR | Status: AC
  Filled 2012-09-04: qty 2

## 2012-09-04 MED ORDER — 0.9 % SODIUM CHLORIDE (POUR BTL) OPTIME
TOPICAL | Status: DC | PRN
  Administered 2012-09-04: 1000 mL

## 2012-09-04 MED ORDER — IBUPROFEN 600 MG PO TABS
600.0000 mg | ORAL_TABLET | Freq: Four times a day (QID) | ORAL | Status: DC | PRN
  Administered 2012-09-05: 600 mg via ORAL
  Filled 2012-09-04: qty 1

## 2012-09-04 MED ORDER — DIPHENHYDRAMINE HCL 50 MG/ML IJ SOLN: 12.5000 mg | Freq: Four times a day (QID) | INTRAMUSCULAR | Status: DC | PRN

## 2012-09-04 MED ORDER — BUPIVACAINE HCL (PF) 0.25 % IJ SOLN
INTRAMUSCULAR | Status: AC
  Filled 2012-09-04: qty 30

## 2012-09-04 SURGICAL SUPPLY — 56 items
BENZOIN TINCTURE PRP APPL 2/3 (GAUZE/BANDAGES/DRESSINGS) IMPLANT
BLADE SURG 15 STRL LF C SS BP (BLADE) ×3 IMPLANT
BLADE SURG 15 STRL SS (BLADE) ×1
CANISTER SUCTION 2500CC (MISCELLANEOUS) ×4 IMPLANT
CLOTH BEACON ORANGE TIMEOUT ST (SAFETY) ×4 IMPLANT
CONT PATH 16OZ SNAP LID 3702 (MISCELLANEOUS) ×4 IMPLANT
COVER TABLE BACK 60X90 (DRAPES) ×4 IMPLANT
DECANTER SPIKE VIAL GLASS SM (MISCELLANEOUS) ×4 IMPLANT
DERMABOND ADVANCED (GAUZE/BANDAGES/DRESSINGS) ×1
DERMABOND ADVANCED .7 DNX12 (GAUZE/BANDAGES/DRESSINGS) ×3 IMPLANT
ELECT REM PT RETURN 9FT ADLT (ELECTROSURGICAL) ×4
ELECTRODE REM PT RTRN 9FT ADLT (ELECTROSURGICAL) ×3 IMPLANT
FORCEPS CUTTING 33CM 5MM (CUTTING FORCEPS) IMPLANT
GAUZE PACKING IODOFORM 1 (PACKING) IMPLANT
GAUZE PACKING IODOFORM 2 (PACKING) ×4 IMPLANT
GLOVE BIO SURGEON STRL SZ7.5 (GLOVE) ×8 IMPLANT
GLOVE BIOGEL PI IND STRL 6.5 (GLOVE) ×3 IMPLANT
GLOVE BIOGEL PI IND STRL 7.5 (GLOVE) ×3 IMPLANT
GLOVE BIOGEL PI INDICATOR 6.5 (GLOVE) ×1
GLOVE BIOGEL PI INDICATOR 7.5 (GLOVE) ×1
GOWN PREVENTION PLUS LG XLONG (DISPOSABLE) ×8 IMPLANT
GOWN PREVENTION PLUS XXLARGE (GOWN DISPOSABLE) ×4 IMPLANT
GOWN STRL REIN XL XLG (GOWN DISPOSABLE) ×16 IMPLANT
NEEDLE HYPO 22GX1.5 SAFETY (NEEDLE) IMPLANT
NS IRRIG 1000ML POUR BTL (IV SOLUTION) ×4 IMPLANT
PACK ABDOMINAL GYN (CUSTOM PROCEDURE TRAY) ×4 IMPLANT
PACK LAVH (CUSTOM PROCEDURE TRAY) ×4 IMPLANT
PACK VAGINAL WOMENS (CUSTOM PROCEDURE TRAY) ×4 IMPLANT
PAD OB MATERNITY 4.3X12.25 (PERSONAL CARE ITEMS) ×4 IMPLANT
PROTECTOR NERVE ULNAR (MISCELLANEOUS) ×4 IMPLANT
SET IRRIG TUBING LAPAROSCOPIC (IRRIGATION / IRRIGATOR) ×4 IMPLANT
SOLUTION ELECTROLUBE (MISCELLANEOUS) ×4 IMPLANT
SPONGE LAP 18X18 X RAY DECT (DISPOSABLE) ×8 IMPLANT
STAPLER VISISTAT 35W (STAPLE) IMPLANT
STRIP CLOSURE SKIN 1/2X4 (GAUZE/BANDAGES/DRESSINGS) IMPLANT
STRIP CLOSURE SKIN 1/4X3 (GAUZE/BANDAGES/DRESSINGS) ×4 IMPLANT
SUT CHROMIC 0 CT 1 (SUTURE) IMPLANT
SUT PLAIN 2 0 XLH (SUTURE) IMPLANT
SUT VIC AB 0 CT1 18XCR BRD8 (SUTURE) ×9 IMPLANT
SUT VIC AB 0 CT1 27 (SUTURE) ×4
SUT VIC AB 0 CT1 27XBRD ANBCTR (SUTURE) ×12 IMPLANT
SUT VIC AB 0 CT1 8-18 (SUTURE) ×3
SUT VIC AB 2-0 CT1 27 (SUTURE)
SUT VIC AB 2-0 CT1 TAPERPNT 27 (SUTURE) IMPLANT
SUT VIC AB 2-0 SH 27 (SUTURE)
SUT VIC AB 2-0 SH 27XBRD (SUTURE) IMPLANT
SUT VIC AB 3-0 X1 27 (SUTURE) ×4 IMPLANT
SUT VIC AB 4-0 PS2 27 (SUTURE) IMPLANT
SUT VICRYL 0 TIES 12 18 (SUTURE) ×4 IMPLANT
SUT VICRYL 1 TIES 12X18 (SUTURE) ×4 IMPLANT
SWAB COLLECTION DEVICE MRSA (MISCELLANEOUS) ×4 IMPLANT
TOWEL OR 17X24 6PK STRL BLUE (TOWEL DISPOSABLE) ×8 IMPLANT
TRAY FOLEY CATH 14FR (SET/KITS/TRAYS/PACK) ×4 IMPLANT
TUBE ANAEROBIC SPECIMEN COL (MISCELLANEOUS) ×4 IMPLANT
WARMER LAPAROSCOPE (MISCELLANEOUS) ×4 IMPLANT
WATER STERILE IRR 1000ML POUR (IV SOLUTION) ×4 IMPLANT

## 2012-09-04 NOTE — Anesthesia Preprocedure Evaluation (Signed)
Anesthesia Evaluation  Patient identified by MRN, date of birth, ID band Patient awake    Reviewed: Allergy & Precautions, H&P , Patient's Chart, lab work & pertinent test results, reviewed documented beta blocker date and time   Airway Mallampati: II TM Distance: >3 FB Neck ROM: full    Dental no notable dental hx.    Pulmonary  breath sounds clear to auscultation  Pulmonary exam normal       Cardiovascular hypertension, Rhythm:regular Rate:Normal     Neuro/Psych    GI/Hepatic   Endo/Other  Hypothyroidism   Renal/GU      Musculoskeletal   Abdominal   Peds  Hematology   Anesthesia Other Findings   Reproductive/Obstetrics                           Anesthesia Physical Anesthesia Plan  ASA: II  Anesthesia Plan: General   Post-op Pain Management:    Induction: Intravenous  Airway Management Planned: Oral ETT  Additional Equipment:   Intra-op Plan:   Post-operative Plan:   Informed Consent: I have reviewed the patients History and Physical, chart, labs and discussed the procedure including the risks, benefits and alternatives for the proposed anesthesia with the patient or authorized representative who has indicated his/her understanding and acceptance.   Dental Advisory Given and Dental advisory given  Plan Discussed with: CRNA and Surgeon  Anesthesia Plan Comments: (  Discussed  general anesthesia, including possible nausea, instrumentation of airway, sore throat,pulmonary aspiration, etc. I asked if the were any outstanding questions, or  concerns before we proceeded. )        Anesthesia Quick Evaluation

## 2012-09-04 NOTE — Progress Notes (Addendum)
I called Dr. Tenny Craw and asked about Iv antibiotic order. He verified he wanted his pt to receive 3 doses of IV antibiotics.  The 1st one pre-op,  And he wanted her to get 2 more dosages.  I reordered the previous order.   I hung the antibiotic that was available 1gm Ancef and then repeated c another 1gm bag at 0915.   Talked c pharmacy, the 3rd and final dose will be 2gm at 0600. Keilyn Haggard T. Rubye Oaks RN

## 2012-09-04 NOTE — OR Nursing (Signed)
Manual Microbiology test request form filled out and taken to Lab and given to [facility].  Ordered on Microbiology request form Anaerobe Culture for the cervix and a genital culture.  Ancef 2g listed as Antibiotic TX.

## 2012-09-04 NOTE — Anesthesia Postprocedure Evaluation (Signed)
Anesthesia Post Note  Patient: [name]  Procedure(s) Performed: Procedure(s) (LRB): LAPAROSCOPIC ASSISTED VAGINAL HYSTERECTOMY (N/A) BILATERAL SALPINGO OOPHORECTOMY (Bilateral)  Anesthesia type: General  Patient location: Women's Unit  Post pain: Pain level controlled  Post assessment: Post-op Vital signs reviewed  Last Vitals:  Filed Vitals:   09/04/12 1500  BP: 117/79  Pulse: 66  Temp:   Resp: 18    Post vital signs: Reviewed  Level of consciousness: sedated  Complications: No apparent anesthesia complications

## 2012-09-04 NOTE — Brief Op Note (Signed)
09/04/2012  9:27 AM  PATIENT:  [name]  42 y.o. female  PRE-OPERATIVE DIAGNOSIS:  PELVIC PAIN / DESCENSUS  POST-OPERATIVE DIAGNOSIS:  PELVIC PAIN / DESCENSUS  PROCEDURE:  Procedure(s): LAPAROSCOPIC ASSISTED VAGINAL HYSTERECTOMY (N/A) BILATERAL SALPINGO OOPHORECTOMY (Bilateral)  SURGEON:  Surgeon(s) and Role:    * Miguel Aschoff, MD - Primary    * W Lodema Hong, MD - Assisting    ANESTHESIA:   general  EBL:  Total I/O In: 2200 [I.V.:2200] Out: 450 [Urine:250; Blood:200]  BLOOD ADMINISTERED:none  DRAINS: Urinary Catheter (Foley)   LOCAL MEDICATIONS USED:  MARCAINE     SPECIMEN:  Source of Specimen:  cervix, uterus, tubes and ovaries  DISPOSITION OF SPECIMEN:  PATHOLOGY  COUNTS:  YES  TOURNIQUET:  * No tourniquets in log *  DICTATION: .Other Dictation: Dictation Number [id number]  PLAN OF CARE: Admit for overnight observation  PATIENT DISPOSITION:  PACU - hemodynamically stable.   Delay start of Pharmacological VTE agent (>24hrs) due to surgical blood loss or risk of bleeding: PAS hose applied.

## 2012-09-04 NOTE — Anesthesia Postprocedure Evaluation (Signed)
  Anesthesia Post Note  Patient: [name]  Procedure(s) Performed: Procedure(s) (LRB): LAPAROSCOPIC ASSISTED VAGINAL HYSTERECTOMY (N/A) BILATERAL SALPINGO OOPHORECTOMY (Bilateral)  Anesthesia type: GA  Patient location: PACU  Post pain: Pain level controlled  Post assessment: Post-op Vital signs reviewed  Last Vitals:  Filed Vitals:   09/04/12 1015  BP: 110/70  Pulse: 67  Temp:   Resp: 16    Post vital signs: Reviewed  Level of consciousness: sedated  Complications: No apparent anesthesia complications

## 2012-09-04 NOTE — Transfer of Care (Signed)
Immediate Anesthesia Transfer of Care Note  Patient: [name]  Procedure(s) Performed: Procedure(s): LAPAROSCOPIC ASSISTED VAGINAL HYSTERECTOMY (N/A) BILATERAL SALPINGO OOPHORECTOMY (Bilateral)  Patient Location: PACU  Anesthesia Type:General  Level of Consciousness: awake  Airway & Oxygen Therapy: Patient Spontanous Breathing  Post-op Assessment: Report given to PACU RN  Post vital signs: stable  Filed Vitals:   09/04/12 0613  BP: 121/83  Pulse: 70  Temp: 36.9 C  Resp: 18    Complications: No apparent anesthesia complications

## 2012-09-05 ENCOUNTER — Encounter (HOSPITAL_COMMUNITY): Payer: Self-pay | Admitting: Obstetrics and Gynecology

## 2012-09-05 LAB — CBC
MCHC: 34.7 g/dL (ref 30.0–36.0)
Platelets: 213 10*3/uL (ref 150–400)
RDW: 13.3 % (ref 11.5–15.5)
WBC: 12.5 10*3/uL — ABNORMAL HIGH (ref 4.0–10.5)

## 2012-09-05 NOTE — Progress Notes (Signed)
Doing well want to go home.  Afebrile V/S stable  Abdomen soft non tender.  Path pending.  Impression: Stable S/P LAVH BSO  Plan:   D/C home   RTC 4 weeks   Resume meds   Percocet 5/325 one every 3 hours as needed for pain   Mimvey 1.0/ 0.5 one daily   No heavy lifting, nothing per vagina.

## 2012-09-05 NOTE — Progress Notes (Signed)
S: Doing well this AM. Catheter has been removed as well as vaginal pack.  Afebrile  BP 102/64   Pulse 68  Abdomen soft wounds healing well  Lab  Hgb 11.4  WBC 12.5  Impression: Stable S/P LAVH BSO  Plan: Will see how she does during the ay with diet, pain meds and ambulation. Possible D/C home this PM

## 2012-09-05 NOTE — Op Note (Signed)
[name], [name]              ACCOUNT NO.:  [id number]  MEDICAL RECORD NO.:  [id number]  LOCATION:  9316                          FACILITY:  [facility]  PHYSICIAN:  Miguel Aschoff, M.D.       DATE OF BIRTH:  02-Mar-1970  DATE OF PROCEDURE:  09/04/2012 DATE OF DISCHARGE:                              OPERATIVE REPORT   PREOPERATIVE DIAGNOSES:  Pelvic relaxation with uterine descensus, possible cystocele.  POSTOPERATIVE DIAGNOSES:  Pelvic relaxation with uterine descensus, possible cystocele, with large endometriomas involving both ovaries.  OPERATIONS AND PROCEDURES:  Laparoscopically assisted total vaginal hysterectomy and bilateral salpingo-oophorectomy.  SURGEON:  Miguel Aschoff, M.D.  ANESTHESIA:  General.  ASSISTANT:  Luvenia Redden, M.D.  COMPLICATIONS:  None.  JUSTIFICATION:  The patient is a 42 year old white female, who reported that during intercourse, her husband was feeling something in the vagina.  On examination, she had significant descensus of her cervix and uterus, and this was what was causing the dyspareunia.  The patient requested that this be corrected and she was brought to the operating room at this time to undergo hysterectomy and possible bilateral salpingo-oophorectomy.  She did have a prior history of endometriosis and at the time of the surgery, she is to be laparoscoped to see if there is significant endometriosis still present.  The patient understands that if significant endometriosis still present, she may undergo bilateral salpingo-oophorectomy in addition to the hysterectomy and possible anterior repair.  Informed consent has been obtained after discussing the risks and benefits of this procedure.  DESCRIPTION OF PROCEDURE:  The patient was taken to the operating room, placed in supine position, and general anesthesia was administered without difficulty.  She was then placed in the supine position, prepped and draped in usual sterile fashion.  She was  then placed in modified lithotomy position.  Foley catheter was inserted.  Examination under anesthesia revealed normal external genitalia.  Normal Bartholin and Skene glands.  Cervix was without gross lesion, presented to a +2 position.  Uterus was noted be retroflexed.  No adnexal masses were noted.  At this point, attention was directed to the umbilicus where a small infraumbilical incision was made.  A Veress needle was inserted and the abdomen was insufflated with 3 L of CO2.  Following the insufflation, the trocar to laparoscope was placed followed by laparoscope itself.  Then, under direct visualization, 2 accessory ports were established in the right and left lower quadrants.  Once this was done, systematic inspection of the pelvis showed implants of endometriosis involving the cul-de-sac, uterosacral ligaments, the bladder peritoneum, and there were areas that were stained with hemosiderin also.  In addition, it was noted that the patient had large bilateral endometriomas involving both ovaries, approximately 5 cm in size each.  The cul-de-sac and small implants of endometriosis, however, was free.  No other abnormalities were noted in the abdomen.  At this point, using the gyrus unit, the right infundibulopelvic ligament was identified, cauterized, and then cut and then dissection continued along the mesovarian ligament and mesosalpinx until the uterus was reached. Once this was done, the round ligaments were identified, grasped, cauterized and cut, and then additional bites were taken with  the gyrus unit adjacent to the uterus, cauterized in the parametrial tissues and then cutting.  This was extended down on the right side at the level of the uterine vessels.  The identical procedure was then carried out on the left side with great care to avoid any injuries to the ureters and again it was possible to dissect the ovary carefully off the pelvic sidewall, elevated and separated  using the gyrus unit moving medially until the uterus was reached.  Again, dissection continued, cauterizing the round ligament, cutting it and then the additional broad ligament structures were grasped, cauterized and cut until the uterine vessels were reached.  At this point, it was elected to proceed with vaginal portion of the procedure.  The patient was placed in high lithotomy position.  Speculum was placed in the vagina.  Cervix was grasped with a tenaculum and then injected with 1% Xylocaine with epinephrine for hemostasis.  Cervical mucosa was then circumscribed anteriorly and posteriorly until the reflection of the peritoneum were found. Posterior peritoneum was then entered.  __________ speculum was then placed through the cul-de-sac.  The uterosacral ligaments were then clamped with Heaney clamps, cut, and suture ligated using suture ligatures of 0 Vicryl.  These pedicles were held.  The cardinal ligaments were then clamped, cut, and suture ligated in a similar fashion again using curved Heaney clamps and suture ligatures of 0 Vicryl.  Additional bites were taken of paracervical fascia using curved Heaney clamps and again the pedicles being suture ligated with 0 Vicryl. This continued until the uterine vessels were identified, clamped, cut, and suture ligated.  It was then possible to deliver the specimen through the cul-de-sac and small amount of tissue still holding the uterus in place was clamped with Heaney clamps.  These pedicles were then cut and ligated using ligatures of 0 Vicryl.  Again, no Kelly clamps were used.  All clamps were again Heaney clamps.  The specimen being freed.  All pedicles were inspected.  Hemostasis appeared to be excellent.  At this point, the posterior cuff was run using running interlocking 0 Vicryl suture.  The peritoneum was then closed using pursestring suture of 0 Vicryl.  Once the peritoneum was closed, the vaginal cuff was closed using  running interlocking 0 Vicryl suture.  An Iodoform pack was then placed in the vagina and this completed the vaginal portion of procedure.  At this point, the patient was placed back in a modified lithotomy position.  The abdomen was reinsufflated and the laparoscope was reinserted to ensure that there was good hemostasis.  All pedicles were inspected, were hemostatically secured. The pelvis was then irrigated with copious amounts of saline and again being assured that there was good hemostasis.  It was elected to complete the procedure.  The CO2 was allowed to escape.  All instruments were removed and the small incisions were closed using subcuticular 4-0 Vicryl and Dermabond was applied.  The estimated blood loss from the procedure was less than 100 mL.  The patient tolerated the procedure well and went to the recovery room in satisfactory condition.  Plan is for the patient to be admitted for extended observation and possibly be discharged home on September 05, 2012.     Miguel Aschoff, M.D.     AR/MEDQ  D:  09/04/2012  T:  09/04/2012  Job:  [id number]

## 2012-09-06 LAB — CULTURE, ROUTINE-GENITAL: Culture: NO GROWTH

## 2012-09-09 LAB — ANAEROBIC CULTURE

## 2012-09-10 NOTE — Discharge Summary (Signed)
[name], [name]              ACCOUNT NO.:  [id number]  MEDICAL RECORD NO.:  [id number]  LOCATION:  9316                          FACILITY:  [facility]  PHYSICIAN:  Miguel Aschoff, M.D.       DATE OF BIRTH:  [birth year]  DATE OF ADMISSION:  09/04/2012 DATE OF DISCHARGE:  09/05/2012                              DISCHARGE SUMMARY   ADMISSION DIAGNOSIS:  Symptomatic pelvic relaxation with descensus endometriosis.  FINAL DIAGNOSIS:  Symptomatic pelvic relaxation with descensus endometriosis.  OPERATIONS AND PROCEDURES:  Laparoscopically assisted vaginal hysterectomy and bilateral salpingo-oophorectomy.  BRIEF HISTORY:  The patient is a 42 year old, white female, who reported that her husband was feeling something unusual during intercourse.  On examination, she was noted to have significant descensus of the uterus and cervix with the cervix presenting almost to the introitus, and this was the object that her husband was feeling.  Because of the associated dyspareunia with descensus as well as a prior history of endometriosis and desired to have this definitively treated, she was being brought to the operating room to undergo laparoscopically assisted vaginal hysterectomy and bilateral salpingo-oophorectomy, if indicated.  Preoperative studies were obtained.  This included admission hemoglobin of 13.2, hematocrit 38, white count was normal.  PT and PTT were within normal limits.  Chemistry profiles were within normal limits.  HOSPITAL COURSE:  On September 02, 2012, laparoscopically assisted vaginal hysterectomy was performed.  At the time of laparoscopy, the patient was noted to have 2 very large bilateral endometriomas involving both ovaries.  Because of this, bilateral salpingo-oophorectomy was also performed at the time of laparoscopic hysterectomy.  The patient's postoperative course was essentially uncomplicated.  She tolerated increasing ambulation and diet well.  Her hemoglobin reached  a nadir of 11.4.  She remained afebrile, and by the afternoon of the 16th was in satisfactory condition, felt stable to be discharged home.  MEDICATIONS FOR HOME:  Included Percocet 1 every 3-4 hours as needed for pain.  She was instructed to resume all prior medications.  To call if there were any problems such as fever, pain, or heavy bleeding.  To place nothing in the vagina until her postoperative visit 4 weeks from now.  She was sent home in satisfactory condition on a regular diet.     Miguel Aschoff, M.D.    AR/MEDQ  D:  09/09/2012  T:  09/10/2012  Job:  [id number]

## 2012-09-21 ENCOUNTER — Ambulatory Visit: Payer: BC Managed Care – PPO | Attending: Physician Assistant

## 2012-09-21 DIAGNOSIS — M546 Pain in thoracic spine: Secondary | ICD-10-CM | POA: Insufficient documentation

## 2012-09-21 DIAGNOSIS — IMO0001 Reserved for inherently not codable concepts without codable children: Secondary | ICD-10-CM | POA: Insufficient documentation

## 2012-09-21 DIAGNOSIS — M519 Unspecified thoracic, thoracolumbar and lumbosacral intervertebral disc disorder: Secondary | ICD-10-CM | POA: Insufficient documentation

## 2013-07-29 ENCOUNTER — Other Ambulatory Visit: Payer: Self-pay | Admitting: Internal Medicine

## 2013-07-29 DIAGNOSIS — R109 Unspecified abdominal pain: Secondary | ICD-10-CM

## 2013-08-02 ENCOUNTER — Ambulatory Visit
Admission: RE | Admit: 2013-08-02 | Discharge: 2013-08-02 | Disposition: A | Discharge status: Home / Self Care | Payer: 59 | Source: Ambulatory Visit | Attending: Internal Medicine | Admitting: Internal Medicine

## 2013-08-02 DIAGNOSIS — R109 Unspecified abdominal pain: Secondary | ICD-10-CM

## 2013-08-05 ENCOUNTER — Other Ambulatory Visit: Payer: Self-pay | Admitting: Internal Medicine

## 2013-08-05 DIAGNOSIS — K8689 Other specified diseases of pancreas: Secondary | ICD-10-CM

## 2013-08-10 ENCOUNTER — Ambulatory Visit
Admission: RE | Admit: 2013-08-10 | Discharge: 2013-08-10 | Disposition: A | Discharge status: Home / Self Care | Payer: 59 | Source: Ambulatory Visit | Attending: Internal Medicine | Admitting: Internal Medicine

## 2013-08-10 DIAGNOSIS — K8689 Other specified diseases of pancreas: Secondary | ICD-10-CM

## 2013-08-10 MED ORDER — GADOBENATE DIMEGLUMINE 529 MG/ML IV SOLN
17.0000 mL | Freq: Once | INTRAVENOUS | Status: AC | PRN
  Administered 2013-08-10: 17 mL via INTRAVENOUS

## 2014-07-30 ENCOUNTER — Ambulatory Visit (HOSPITAL_BASED_OUTPATIENT_CLINIC_OR_DEPARTMENT_OTHER): Payer: BC Managed Care – PPO | Admitting: Anesthesiology

## 2014-07-30 ENCOUNTER — Ambulatory Visit
Admission: RE | Admit: 2014-07-30 | Discharge: 2014-07-30 | Disposition: A | Discharge status: Home / Self Care | Payer: BC Managed Care – PPO | Source: Ambulatory Visit | Attending: Gastroenterology | Admitting: Gastroenterology

## 2014-07-30 ENCOUNTER — Ambulatory Visit (HOSPITAL_BASED_OUTPATIENT_CLINIC_OR_DEPARTMENT_OTHER): Payer: BC Managed Care – PPO | Admitting: Gastroenterology

## 2014-07-30 ENCOUNTER — Encounter (HOSPITAL_BASED_OUTPATIENT_CLINIC_OR_DEPARTMENT_OTHER)
Admission: RE | Disposition: A | Discharge status: Home / Self Care | Payer: Self-pay | Source: Ambulatory Visit | Attending: Gastroenterology

## 2014-07-30 ENCOUNTER — Encounter (HOSPITAL_BASED_OUTPATIENT_CLINIC_OR_DEPARTMENT_OTHER): Payer: Self-pay

## 2014-07-30 DIAGNOSIS — R1032 Left lower quadrant pain: Secondary | ICD-10-CM | POA: Insufficient documentation

## 2014-07-30 DIAGNOSIS — K625 Hemorrhage of anus and rectum: Secondary | ICD-10-CM | POA: Insufficient documentation

## 2014-07-30 DIAGNOSIS — K51211 Ulcerative (chronic) proctitis with rectal bleeding: Secondary | ICD-10-CM | POA: Insufficient documentation

## 2014-07-30 DIAGNOSIS — Z86010 Personal history of colonic polyps: Secondary | ICD-10-CM

## 2014-07-30 DIAGNOSIS — K6289 Other specified diseases of anus and rectum: Secondary | ICD-10-CM | POA: Insufficient documentation

## 2014-07-30 HISTORY — DX: Diarrhea, unspecified: R19.7

## 2014-07-30 HISTORY — DX: Polyp of colon: K63.5

## 2014-07-30 HISTORY — PX: COLONOSCOPY: SHX174

## 2014-07-30 HISTORY — DX: Other specified diseases of anus and rectum: K62.89

## 2014-07-30 SURGERY — DONT USE, USE 1094-COLONOSCOPY, DIAGNOSTIC (SCREENING)
Anesthesia: Anesthesia MAC / Sedation | Site: Anus | Wound class: Clean Contaminated

## 2014-07-30 MED ORDER — ONDANSETRON 4 MG PO TBDP
4.0000 mg | ORAL_TABLET | ORAL | Status: DC | PRN
Start: 2014-07-30 — End: 2014-07-30

## 2014-07-30 MED ORDER — PEG 3350-KCL-NABCB-NACL-NASULF 236 G PO SOLR
4.0000 L | ORAL | Status: DC | PRN
Start: 2014-07-30 — End: 2014-07-30

## 2014-07-30 MED ORDER — SODIUM CHLORIDE 0.9 % IV SOLN
INTRAVENOUS | Status: DC | PRN
Start: 2014-07-30 — End: 2014-07-30

## 2014-07-30 MED ORDER — SODIUM CHLORIDE 0.9 % IV SOLN
INTRAVENOUS | Status: DC
Start: 2014-07-30 — End: 2014-07-30

## 2014-07-30 MED ORDER — ONDANSETRON HCL 4 MG/2ML IJ SOLN
4.0000 mg | INTRAMUSCULAR | Status: DC | PRN
Start: 2014-07-30 — End: 2014-07-30

## 2014-07-30 MED ORDER — PROPOFOL 10 MG/ML IV EMUL (WRAP)
INTRAVENOUS | Status: AC
Start: 2014-07-30 — End: ?
  Filled 2014-07-30: qty 20

## 2014-07-30 MED ORDER — PROPOFOL 10 MG/ML IV EMUL (WRAP)
INTRAVENOUS | Status: DC | PRN
Start: 2014-07-30 — End: 2014-07-30
  Administered 2014-07-30: 50 mg via INTRAVENOUS
  Administered 2014-07-30: 100 mg via INTRAVENOUS
  Administered 2014-07-30 (×3): 50 mg via INTRAVENOUS

## 2014-07-30 SURGICAL SUPPLY — 13 items
CLIP QUICKPRO REPOSITIONAL (Supply) IMPLANT
CONN IRRG TORRENT 1WAYVAL ENDO (Supply) ×2 IMPLANT
ENDOCUFF OVERTUBE GREEN (Supply) IMPLANT
ENDOCUFF VISION PURPLE (Supply) IMPLANT
FORCEP BIOPSY HOT RADIAL JAW 4 (Supply) IMPLANT
FORCEP BIOPSY RAD JAW 1333-40 (Supply) IMPLANT
FORCEP RADIAL JAW JUMBO 240CM (Supply) ×2 IMPLANT
MARKER ENDOSCOPIC SPOT (Supply) IMPLANT
NDL INTERJECT SCLERO 25G (Supply) IMPLANT
SNARE ROTATE SM OVAL MED STFF (Supply) IMPLANT
SNARE SMALL CAPTIV 6230 (Supply) IMPLANT
SNARE WIRE DISP 10MM OVAL (Supply) IMPLANT
TUBING IRRIGATION TORRENT (Supply) ×2 IMPLANT

## 2014-07-30 NOTE — Brief Op Note (Signed)
PROCEDURE NOTE    Date Time: 07/30/2014 11:58 AM  Patient Name: [name],[name]    Procedure:   No orders of the defined types were placed in this encounter.       Providers Performing:   Nolon Stalls, MD    Consent:   This procedure has been fully reviewed with the patient and written informed consent has been obtained.    Timeout:   Was performed    Anesthesia:   Monitored Anesthesia Care      Medical History:     Past Medical History   Diagnosis Date   . Bloody stools 08/07/2012   . Anxiety    . Colon polyps    . Proctitis    . Diarrhea         Pre-Procedure Dx:   Pre-Op Diagnosis Codes:     * Rectal bleeding [K62.5]    Post-Procedure Dx:   Post-Op Diagnosis Codes:     * Rectal bleeding [K62.5]    Problem   Proctitis   History of Adenomatous Polyp of Colon       Complications:   No immediate complication.    Brief Notes:   Granular, inflamed and vascular-pattern-decreased mucosa in the distal rectum.  Biopsied.   - The examination was otherwise normal.   - Proctitis likely cause of symptoms. Under better control now.    Endoscopy was performed in the the standard fashion.  Details of procedure, detailed recommendations, and pictures were recorded under the Prov[facility] System.      Signed by: Nolon Stalls, MD

## 2014-07-30 NOTE — Transfer of Care (Signed)
Anesthesia Transfer of Care Note      Patient Name:     Angel [name]    Procedures Performed:     Procedure(s):  COLONOSCOPY    Anesthesia Type:     Conscious Sedation    Patient Location:     Endo PACU    Last Vitals:     Filed Vitals:    07/30/14 1154   BP: 106/78   Pulse: 74   Resp: 22   SpO2: 96%       Post Pain:     Patient not complaining of pain, continue current therapy    Mental Status:     Awake    Respiratory Function:     Adequate    Cardiovascular:       Stable    Nausea/Vomiting:      Patient not complaining of nausea     Hydration Status:     Adequate    Post Assessment:      No apparent anesthetic complications, no reportable events     Deon Pilling, MD  07/30/2014 11:55 AM               vss

## 2014-07-30 NOTE — Anesthesia Preprocedure Evaluation (Signed)
Anesthesia Evaluation    AIRWAY    Mallampati: II    TM distance: >3 FB  Neck ROM: full  Mouth Opening:full   CARDIOVASCULAR    cardiovascular exam normal, regular and normal       DENTAL    no notable dental hx     PULMONARY    pulmonary exam normal and clear to auscultation     OTHER FINDINGS                      Anesthesia Plan    ASA 1     MAC                     intravenous induction   Detailed anesthesia plan: MAC      Post Op: other  Post op pain management: per surgeon    informed consent obtained      pertinent labs reviewed

## 2014-07-30 NOTE — Anesthesia Postprocedure Evaluation (Signed)
Anesthesia Post Operative Evaluation      Patient Name:     Angel [name]    Procedures Performed:     Procedure(s):  COLONOSCOPY    Anesthesia Type:      Conscious Sedation    Patient Location:     ENDO/ENDO    Last vitals:     Temp:     HR: Heart Rate: 74   BP: BP: 106/78 mmHg   RR: Resp Rate: 22   SpO2 SpO2: 96 %     Post Op Pain:      Patient not complaining of pain, continue current therapy     Mental Status:      Awake    Respiratory Function:      Tolerating nasal cannula    Cardiovascular:      Stable    Nausea/Vomiting:      Patient not complaining of nausea or vomiting    Hydration Status:      Adequate    Post Assessment:      no apparent anesthetic complications    Deon Pilling, MD  07/30/2014 11:56 AM

## 2014-07-30 NOTE — Discharge Instructions (Signed)
[facility]  Colonoscopy/Sigmoidoscopy  Discharge Instructions    Thank you for allowing us to be a part of your health care experience.  We realize you may not fully recall your discharge care.  The following information is to guide you.    A.  After you leave the hospital:   1.  Due to the effects of the sedatives, you may feel tired for the remainder of today.   2.  DO NOT DRIVE OR OPERATE HAZARDOUS MACHINERY until tomorrow.   3.  Please rest and drink extra fluids today.  Avoid alcohol today.   4.  Resume your normal diet as tolerated.   5. If you experience anal soreness, you may apply a soothing ointment of your choice.   6.  You may feel bloated and pass air today.   7.  You may resume your normal activities (work) tomorrow.    B.  If you experience any of the following danger signs or symptoms, call your doctor immediately:  After business hours call [phone] for [facility] for physician guidance.    1.   Passing  Blood in the stool or a change of stool consistency (black).   2.  Passing Clotted blood.   3.  New onset of pain or distension, that does not subside or prevents you from normal activity.   4.  Redness or swelling at the IV insertion site that continues or worsens over 2-3 days. Initial warm compresses may be helpful.    All of us at the [facility] who supported you today during your procedure wish you a quick and comfortable recovery.  For any questions or concerns about your personal care, contact us at [phone].

## 2014-07-31 ENCOUNTER — Encounter (HOSPITAL_BASED_OUTPATIENT_CLINIC_OR_DEPARTMENT_OTHER): Payer: Self-pay | Admitting: Gastroenterology

## 2014-12-25 ENCOUNTER — Other Ambulatory Visit: Payer: Self-pay | Admitting: Physician Assistant

## 2014-12-25 DIAGNOSIS — M79672 Pain in left foot: Secondary | ICD-10-CM

## 2014-12-27 ENCOUNTER — Ambulatory Visit
Admission: RE | Admit: 2014-12-27 | Discharge: 2014-12-27 | Disposition: A | Discharge status: Home / Self Care | Payer: BC Managed Care – PPO | Source: Ambulatory Visit | Attending: Physician Assistant | Admitting: Physician Assistant

## 2014-12-27 DIAGNOSIS — M7752 Other enthesopathy of left foot: Secondary | ICD-10-CM | POA: Insufficient documentation

## 2014-12-27 DIAGNOSIS — S93149A Subluxation of metatarsophalangeal joint of unspecified toe(s), initial encounter: Secondary | ICD-10-CM | POA: Insufficient documentation

## 2014-12-27 DIAGNOSIS — M79672 Pain in left foot: Secondary | ICD-10-CM

## 2015-01-28 ENCOUNTER — Ambulatory Visit: Payer: BC Managed Care – PPO | Attending: Orthopaedic Surgery

## 2015-01-28 DIAGNOSIS — R262 Difficulty in walking, not elsewhere classified: Secondary | ICD-10-CM | POA: Insufficient documentation

## 2015-01-28 DIAGNOSIS — M7672 Peroneal tendinitis, left leg: Secondary | ICD-10-CM | POA: Insufficient documentation

## 2015-01-28 DIAGNOSIS — M25572 Pain in left ankle and joints of left foot: Secondary | ICD-10-CM | POA: Insufficient documentation

## 2015-01-28 DIAGNOSIS — M722 Plantar fascial fibromatosis: Secondary | ICD-10-CM | POA: Insufficient documentation

## 2015-02-22 ENCOUNTER — Ambulatory Visit: Payer: BC Managed Care – PPO | Attending: Orthopaedic Surgery

## 2015-02-22 DIAGNOSIS — M722 Plantar fascial fibromatosis: Secondary | ICD-10-CM | POA: Insufficient documentation

## 2015-02-22 DIAGNOSIS — M7672 Peroneal tendinitis, left leg: Secondary | ICD-10-CM | POA: Insufficient documentation

## 2015-02-22 DIAGNOSIS — R262 Difficulty in walking, not elsewhere classified: Secondary | ICD-10-CM | POA: Insufficient documentation

## 2015-02-22 DIAGNOSIS — M25572 Pain in left ankle and joints of left foot: Secondary | ICD-10-CM | POA: Insufficient documentation

## 2015-03-25 ENCOUNTER — Ambulatory Visit: Payer: BC Managed Care – PPO

## 2015-04-03 ENCOUNTER — Other Ambulatory Visit (INDEPENDENT_AMBULATORY_CARE_PROVIDER_SITE_OTHER): Payer: Self-pay | Admitting: Orthopaedic Surgery

## 2015-04-03 DIAGNOSIS — M722 Plantar fascial fibromatosis: Secondary | ICD-10-CM

## 2015-04-03 DIAGNOSIS — M6788 Other specified disorders of synovium and tendon, other site: Secondary | ICD-10-CM

## 2015-04-06 ENCOUNTER — Ambulatory Visit (INDEPENDENT_AMBULATORY_CARE_PROVIDER_SITE_OTHER)
Admission: RE | Admit: 2015-04-06 | Discharge: 2015-04-06 | Disposition: A | Discharge status: Home / Self Care | Payer: BC Managed Care – PPO | Source: Ambulatory Visit | Attending: Orthopaedic Surgery | Admitting: Orthopaedic Surgery

## 2015-04-06 DIAGNOSIS — M722 Plantar fascial fibromatosis: Secondary | ICD-10-CM

## 2015-04-06 DIAGNOSIS — M6788 Other specified disorders of synovium and tendon, other site: Secondary | ICD-10-CM

## 2015-11-02 ENCOUNTER — Observation Stay: Payer: BC Managed Care – PPO | Admitting: Emergency Medicine

## 2015-11-02 ENCOUNTER — Emergency Department: Payer: BC Managed Care – PPO

## 2015-11-02 ENCOUNTER — Observation Stay
Admission: EM | Admit: 2015-11-02 | Discharge: 2015-11-03 | Disposition: A | Discharge status: Home / Self Care | Payer: BC Managed Care – PPO | Attending: Emergency Medicine | Admitting: Emergency Medicine

## 2015-11-02 DIAGNOSIS — Z86010 Personal history of colonic polyps: Secondary | ICD-10-CM | POA: Insufficient documentation

## 2015-11-02 DIAGNOSIS — Z885 Allergy status to narcotic agent status: Secondary | ICD-10-CM | POA: Insufficient documentation

## 2015-11-02 DIAGNOSIS — K5100 Ulcerative (chronic) pancolitis without complications: Principal | ICD-10-CM

## 2015-11-02 DIAGNOSIS — F419 Anxiety disorder, unspecified: Secondary | ICD-10-CM | POA: Insufficient documentation

## 2015-11-02 DIAGNOSIS — R109 Unspecified abdominal pain: Secondary | ICD-10-CM | POA: Diagnosis present

## 2015-11-02 DIAGNOSIS — R1031 Right lower quadrant pain: Secondary | ICD-10-CM

## 2015-11-02 DIAGNOSIS — Z886 Allergy status to analgesic agent status: Secondary | ICD-10-CM | POA: Insufficient documentation

## 2015-11-02 DIAGNOSIS — K529 Noninfective gastroenteritis and colitis, unspecified: Secondary | ICD-10-CM

## 2015-11-02 HISTORY — DX: Noninfective gastroenteritis and colitis, unspecified: K52.9

## 2015-11-02 LAB — VH URINALYSIS WITH MICROSCOPIC AND CULTURE IF INDICATED
Bilirubin, UA: NEGATIVE
Blood, UA: NEGATIVE
Glucose, UA: NEGATIVE mg/dL
Ketones UA: NEGATIVE mg/dL
Leukocyte Esterase, UA: NEGATIVE Leu/uL
Nitrite, UA: NEGATIVE
Protein, UR: NEGATIVE mg/dL
Urine Specific Gravity: 1.026 (ref 1.001–1.040)
Urobilinogen, UA: NORMAL mg/dL
pH, Urine: 5 pH (ref 5.0–8.0)

## 2015-11-02 LAB — COMPREHENSIVE METABOLIC PANEL
ALT: 26 U/L (ref 0–55)
AST (SGOT): 20 U/L (ref 10–42)
Albumin/Globulin Ratio: 0.95 Ratio (ref 0.70–1.50)
Albumin: 3.7 gm/dL (ref 3.5–5.0)
Alkaline Phosphatase: 79 U/L (ref 40–145)
Anion Gap: 11.8 mMol/L (ref 7.0–18.0)
BUN / Creatinine Ratio: 16.7 Ratio (ref 10.0–30.0)
BUN: 14 mg/dL (ref 7–22)
Bilirubin, Total: 0.4 mg/dL (ref 0.1–1.2)
CO2: 24.2 mMol/L (ref 20.0–30.0)
Calcium: 9.5 mg/dL (ref 8.5–10.5)
Chloride: 108 mMol/L (ref 98–110)
Creatinine: 0.84 mg/dL (ref 0.60–1.20)
EGFR: 85 mL/min/{1.73_m2} (ref 60–150)
Globulin: 3.9 gm/dL (ref 2.0–4.0)
Glucose: 88 mg/dL (ref 70–99)
Osmolality Calc: 279 mOsm/kg (ref 275–300)
Potassium: 4 mMol/L (ref 3.5–5.3)
Protein, Total: 7.6 gm/dL (ref 6.0–8.3)
Sodium: 140 mMol/L (ref 136–147)

## 2015-11-02 LAB — CBC AND DIFFERENTIAL
Basophils %: 0.9 % (ref 0.0–3.0)
Basophils Absolute: 0.1 10*3/uL (ref 0.0–0.3)
Eosinophils %: 1.5 % (ref 0.0–7.0)
Eosinophils Absolute: 0.1 10*3/uL (ref 0.0–0.8)
Hematocrit: 44.6 % (ref 36.0–48.0)
Hemoglobin: 14.8 gm/dL (ref 12.0–16.0)
Lymphocytes Absolute: 2.4 10*3/uL (ref 0.6–5.1)
Lymphocytes: 30.5 % (ref 15.0–46.0)
MCH: 31 pg (ref 28–35)
MCHC: 33 gm/dL (ref 32–36)
MCV: 93 fL (ref 80–100)
MPV: 7.2 fL (ref 6.0–10.0)
Monocytes Absolute: 0.6 10*3/uL (ref 0.1–1.7)
Monocytes: 8 % (ref 3.0–15.0)
Neutrophils %: 59.1 % (ref 42.0–78.0)
Neutrophils Absolute: 4.6 10*3/uL (ref 1.7–8.6)
PLT CT: 296 10*3/uL (ref 130–440)
RBC: 4.79 10*6/uL (ref 3.80–5.00)
RDW: 11.7 % (ref 11.0–14.0)
WBC: 7.8 10*3/uL (ref 4.0–11.0)

## 2015-11-02 LAB — C-REACTIVE PROTEIN: C-Reactive Protein: 0.86 mg/dL — ABNORMAL HIGH (ref 0.02–0.80)

## 2015-11-02 LAB — LIPASE: Lipase: 20 U/L (ref 8–78)

## 2015-11-02 LAB — SEDIMENTATION RATE: Sed Rate: 9 mm/hr (ref 0–20)

## 2015-11-02 MED ORDER — VH HYDROMORPHONE HCL PF 1 MG/ML CARPUJECT
1.0000 mg | Freq: Once | INTRAMUSCULAR | Status: AC
Start: 2015-11-02 — End: 2015-11-02
  Administered 2015-11-02: 1 mg via INTRAVENOUS

## 2015-11-02 MED ORDER — ONDANSETRON HCL 4 MG/2ML IJ SOLN
INTRAMUSCULAR | Status: AC
Start: 2015-11-02 — End: ?
  Filled 2015-11-02: qty 2

## 2015-11-02 MED ORDER — ONDANSETRON HCL 4 MG/2ML IJ SOLN
4.0000 mg | Freq: Once | INTRAMUSCULAR | Status: AC
Start: 2015-11-02 — End: 2015-11-02
  Administered 2015-11-02: 4 mg via INTRAVENOUS

## 2015-11-02 MED ORDER — SODIUM CHLORIDE 0.9 % IV SOLN
INTRAVENOUS | Status: DC
Start: 2015-11-02 — End: 2015-11-03

## 2015-11-02 MED ORDER — SODIUM CHLORIDE 0.9 % IV BOLUS
500.0000 mL | Freq: Once | INTRAVENOUS | Status: AC
Start: 2015-11-02 — End: 2015-11-03
  Administered 2015-11-02: 500 mL via INTRAVENOUS

## 2015-11-02 MED ORDER — HYDROMORPHONE HCL 2 MG/ML IJ SOLN
INTRAMUSCULAR | Status: AC
Start: 2015-11-02 — End: ?
  Filled 2015-11-02: qty 1

## 2015-11-02 MED ORDER — IOHEXOL 350 MG/ML IV SOLN
100.0000 mL | Freq: Once | INTRAVENOUS | Status: AC | PRN
Start: 2015-11-02 — End: 2015-11-02
  Administered 2015-11-02: 100 mL via INTRAVENOUS

## 2015-11-02 NOTE — ED Provider Notes (Signed)
Physician/Midlevel provider first contact with patient: 11/02/15 2239       [facility]   EMERGENCY DEPARTMENT     Patient Name: Angel [name],Angel [name]  Encounter Date:  11/02/2015  Attending Physician: Emeterio Reeve, MD  PCP: Clayborne Dana, [place]  Patient DOB:  [birth year]  MRN:  [id number]  Room:  2508/2508-A   Physician/Midlevel provider first contact with patient: 11/02/15 2239         Diagnosis / Disposition     Clinical Impression  1. Pancolitis    2. RLQ abdominal pain        Disposition  ED Disposition     ED Disposition Condition Date/Time Comment    Observation  Tue Nov 03, 2015  2:32 AM Admitting Physician: Harriette Bouillon [[id number]]   Diagnosis: Abdominal pain [[id number]]   Estimated Length of Stay: < 2 midnights   Tentative Discharge Plan?: Home or Self Care [1]   Patient Class: Observation [104]            Prescriptions  There are no discharge medications for this patient.      Primary Care or main doctor  Clayborne Dana, PA    Follow up  No follow-up provider specified.    MDM / Critical Care     Diagnostic Considerations:  Patient's initial history would be consistent with early appendicitis. Patient states that she was diagnosed with pancolitis last year after a colonoscopy. The colonoscopy grossly showed proctitis but biopsies of the proximal, mid and distal colon were positive for inflammatory disease. It is most likely the patient has inflammatory bowel disease.    ED course:  Previous records reviewed.  D/dx, workup, anticipated clinical course discussed with patient/family.  Results reviewed with patient/family.  All questions answered.  Patient/family comfortable with treatment plan.  Case reviewed with consultant; history, physical exam, ancillary studies, and plan of care discussed.  Patient's pain was treated in the ED with IV Dilaudid. It tended to recur. CT scan did not show any appendicitis but it did show inflammatory disease which actually looked better than on a previous CT. I  believe the patient has an exacerbation of her inflammatory bowel disease and would benefit from observation. Dr. Rhetta Mura recommended that we called the gastroenterologist on call tomorrow morning to reassess the patient.    Consultations:  Dr. Geryl Councilman, surgeon on call; Dr. Ian Malkin, gastroenterologist    Critical Care:   None    Procedures     none  Signed by: Emeterio Reeve, MD      History of Presenting Illness     Chief complaint: Abdominal Pain    HPI/ROS is limited by: none  HPI/ROS given by: patient    Angel [name] is a 45 y.o. female who has had abdominal pain for the past 2 days. It started in the epigastric area and then moved to the right side. She notes decreased appetite. She states that she feels most comfortable with her knees drawn up and it hurts to extend her torso. She states that whenever she moves or coughs it hurts. She rates it at 5 on scale 1-10 at this moment. Denies any fever or chills.   Review of Systems   Review of Systems   Constitutional: Positive for activity change and appetite change.   HENT: Negative.    Eyes: Negative.    Respiratory: Negative.    Cardiovascular: Negative.    Gastrointestinal: Positive for abdominal pain and blood in stool. Negative  for rectal pain.   Endocrine: Negative.    Genitourinary: Negative.    Musculoskeletal: Positive for back pain.   Neurological: Negative.    Hematological:        History of anemia   Psychiatric/Behavioral: Positive for dysphoric mood (not suicidal). The patient is nervous/anxious.    All other systems reviewed and are negative.        Physical Exam     Blood pressure 117/85, pulse (!) 55, temperature 97.7 F (36.5 C), temperature source Oral, resp. rate 18, height 1.676 m, weight 92.8 kg, last menstrual period 10/31/2015, SpO2 99 %.    Physical Exam   Constitutional: She is oriented to person, place, and time. She appears distressed.   HENT:   Head: Normocephalic and atraumatic.   Eyes: Conjunctivae and EOM are normal.  Pupils are equal, round, and reactive to light.   Neck: Neck supple.   Cardiovascular: Normal rate, regular rhythm and normal heart sounds.    Pulmonary/Chest: Effort normal and breath sounds normal.   Abdominal: Soft. There is tenderness (Right lower quadrant).   Musculoskeletal: Normal range of motion.   Neurological: She is alert and oriented to person, place, and time.   Skin: Skin is warm and dry.   Psychiatric: Mood, memory, affect and judgment normal.   Nursing note and vitals reviewed.     Allergies     Pt is allergic to asa [aspirin] and codeine.    Medications       Current Facility-Administered Medications:   .  0.9%  NaCl infusion, , Intravenous, Continuous, English, Tiffany R, [place], Last Rate: 100 mL/hr at 11/03/15 0354  .  acetaminophen (TYLENOL) tablet 650 mg, 650 mg, Oral, Q4H PRN **OR** acetaminophen (TYLENOL) 160 MG/5ML oral solution 650 mg, 650 mg, per NG tube, Q4H PRN **OR** acetaminophen (TYLENOL) suppository 650 mg, 650 mg, Rectal, Q4H PRN, English, Tiffany R, PA  .  ciprofloxacin (CIPRO) 400mg  in D5W IVPB (premix), 400 mg, Intravenous, Q12H, English, Tiffany R, PA, Last Rate: 200 mL/hr at 11/03/15 0316, 400 mg at 11/03/15 0316  .  dicyclomine (BENTYL) capsule 10 mg, 10 mg, Oral, TID AC & HS, English, Tiffany R, PA  .  enoxaparin (LOVENOX) syringe 40 mg, 40 mg, Subcutaneous, Q24H, English, Tiffany R, PA  .  ketorolac (TORADOL) injection 15 mg, 15 mg, Intravenous, Q6H PRN, English, Tiffany R, PA  .  lactobacillus species (BIO-K PLUS) capsule 50 Billion CFU, 50 Billion CFU, Oral, Daily, English, Tiffany R, PA  .  metroNIDAZOLE (FLAGYL) tablet 500 mg, 500 mg, Oral, Q8H SCH, English, Tiffany R, PA  .  naloxone (NARCAN) injection 0.4 mg, 0.4 mg, Intravenous, PRN, English, Tiffany R, PA  .  ondansetron (ZOFRAN-ODT) disintegrating tablet 4 mg, 4 mg, Oral, Q8H PRN **OR** ondansetron (ZOFRAN) injection 4 mg, 4 mg, Intravenous, Q8H PRN, English, Tiffany R, PA  .  traMADol (ULTRAM) tablet 50 mg, 50  mg, Oral, Q6H PRN, English, Tiffany R, PA     Past Medical History     Pt has a past medical history of Anxiety; Bloody stools (08/07/2012); Colon polyps; Diarrhea; Pancolitis; and Proctitis.    Past Surgical History     Pt has a past surgical history that includes Colonoscopy (N/A, 07/30/2014).    Family History     The family history is not on file.    Social History     Pt reports that she has never smoked. She has never used smokeless tobacco. She reports that she drinks  about 3.6 oz of alcohol per week . She reports that she does not use drugs.    Orders Placed     Orders Placed This Encounter   Procedures   . Stool Culture   . Examination for Ova and Parasites   . CT Abdomen Pelvis with IV Cont   . CBC and differential   . Comprehensive metabolic panel   . Lipase   . Urinalysis w Microscopic and Culture if Indicated   . C Reactive Protein (CRP)   . Sedimentation rate (ESR)   . Beta HCG, Qual, Serum   . Diet clear liquid   . Vital signs   . Pulse Oximetry   . Pain Assessment   . Mobility Protocol   . Notify physician   . Education: Activity   . Education: Disease Process & Condition   . Education: Pain Management   . Education: Falls Risk   . Full Code   . Inpatient consult to gastroenterology   . saline lock IV   . Place  for Observation Services   . Bed Request   . ED Admission Request       Diagnostic Results     The results of the diagnostic studies below have been reviewed by myself:  Labs  Results     Procedure Component Value Units Date/Time    Beta HCG, Drema Dallas, Serum [[id number]] Collected:  11/02/15 2059    Specimen:  Plasma Updated:  11/03/15 0429     BHCG Qual Negative    C Reactive Protein (CRP) [[id number]]  (Abnormal) Collected:  11/02/15 2059    Specimen:  Plasma Updated:  11/02/15 2337     C-Reactive Protein 0.86 (H) mg/dL     Sedimentation rate (ESR) [[id number]] Collected:  11/02/15 2059    Specimen:  Blood Updated:  11/02/15 2322     Sed Rate 9 mm/hr     Urinalysis w Microscopic and Culture if  Indicated [[id number]] Collected:  11/02/15 2236    Specimen:  Urine, Random Updated:  11/02/15 2301     Color, UA Yellow     Clarity, UA Clear     Specific Gravity, UR 1.026     pH, Urine 5.0 pH      Protein, UR Negative mg/dL      Glucose, UA Negative mg/dL      Ketones UA Negative mg/dL      Bilirubin, UA Negative     Blood, UA Negative     Nitrite, UA Negative     Urobilinogen, UA Normal mg/dL      Leukocyte Esterase, UA Negative Leu/uL     Comprehensive metabolic panel [[id number]] Collected:  11/02/15 2059    Specimen:  Plasma Updated:  11/02/15 2153     Sodium 140 mMol/L      Potassium 4.0 mMol/L      Chloride 108 mMol/L      CO2 24.2 mMol/L      Calcium 9.5 mg/dL      Glucose 88 mg/dL      Creatinine 2.44 mg/dL      BUN 14 mg/dL      Protein, Total 7.6 gm/dL      Albumin 3.7 gm/dL      Alkaline Phosphatase 79 U/L      ALT 26 U/L      AST (SGOT) 20 U/L      Bilirubin, Total 0.4 mg/dL      Albumin/Globulin Ratio 0.95 Ratio      Anion Gap  11.8 mMol/L      BUN/Creatinine Ratio 16.7 Ratio      EGFR 85 mL/min/1.59m2      Osmolality Calc 279 mOsm/kg      Globulin 3.9 gm/dL     Lipase [[id number]] Collected:  11/02/15 2059    Specimen:  Plasma Updated:  11/02/15 2153     Lipase 20 U/L     CBC and differential [[id number]] Collected:  11/02/15 2059    Specimen:  Blood from Blood Updated:  11/02/15 2128     WBC 7.8 K/cmm      RBC 4.79 M/cmm      Hemoglobin 14.8 gm/dL      Hematocrit 78.2 %      MCV 93 fL      MCH 31 pg      MCHC 33 gm/dL      RDW 95.6 %      PLT CT 296 K/cmm      MPV 7.2 fL      NEUTROPHIL % 59.1 %      Lymphocytes 30.5 %      Monocytes 8.0 %      Eosinophils % 1.5 %      Basophils % 0.9 %      Neutrophils Absolute 4.6 K/cmm      Lymphocytes Absolute 2.4 K/cmm      Monocytes Absolute 0.6 K/cmm      Eosinophils Absolute 0.1 K/cmm      BASO Absolute 0.1 K/cmm           Radiologic Studies  Radiology Results (24 Hour)     Procedure Component Value Units Date/Time    CT Abdomen Pelvis with IV Cont [[id number]]  Collected:  11/02/15 2352    Order Status:  Completed Updated:  11/03/15 0003    Narrative:       Clinical History:  Reason For Exam: ABDOMINAL PAIN, RLQ  rlq abdominal pain x 2 days    Technique:  CT of abdomen and pelvis with intravenous contrast obtained. Multiplanar reconstructions performed.    CT images were acquired utilizing Automated Exposure Control for dose reduction.     Contrast:  IOHEXOL 350 MG/ML IV SOLN/100 mL    Comparison:  September 08, 2009    Findings:    Lower chest:  The lung bases are clear. No pleural effusions.    Abdomen:  The liver, spleen and pancreas are normal. No mass or pseudocyst. No biliary obstruction. The gallbladder is unremarkable. No adrenal enlargement. Normal-appearing kidneys without calculus, mass or obstruction. No periaortic adenopathy or hemorrhage or   aneurysm. There is no small bowel obstruction or appendicitis. A slightly prominent fluid filled terminal ileum is present. No colonic obstruction. The pericolonic stranding seen in the right lateral peritoneal cavity previously has diminished. Small   nodular density in the right lateral gutter measuring 6.7 mm. No pneumoperitoneum or abdominal wall hernia.    Pelvis:  No iliac adenopathy or ascites. Intrauterine contraceptive device projecting within the endometrium. The uterus is not enlarged. A tampon in the vagina. No adnexal mass. Urinary bladder is empty. No bladder calculus.    Bones and Soft Tissues:  Schmorl's nodules in the thoracolumbar region. No acute bony pathology.      Impression:       Interval decrease in the pericolonic inflammation involving the ascending colon with the residual or recurrent thickening and tiny pericolonic nodule which could indicate a mild degree of colitis. No appendicitis, bowel obstruction or perforation or  abscess.    Intrauterine contraceptive device.    ReadingStation:[id number]          Production assistant, radio     Signed by: Emeterio Reeve, MD        Emeterio Reeve, MD  11/03/15  [phone]

## 2015-11-02 NOTE — ED Triage Notes (Signed)
Patient states she has RLQ x 2 days. Patient reports some episodes of nausea.

## 2015-11-02 NOTE — ED Notes (Signed)
Provided pt w/pillow and showed her how to hug it for splinting her stomach.

## 2015-11-02 NOTE — ED Notes (Signed)
Pt to CT

## 2015-11-03 DIAGNOSIS — R109 Unspecified abdominal pain: Secondary | ICD-10-CM | POA: Diagnosis present

## 2015-11-03 DIAGNOSIS — K529 Noninfective gastroenteritis and colitis, unspecified: Secondary | ICD-10-CM

## 2015-11-03 LAB — HCG, SERUM, QUALITATIVE: BHCG Qualitative: NEGATIVE

## 2015-11-03 MED ORDER — MESALAMINE 400 MG PO CPDR
400.0000 mg | DELAYED_RELEASE_CAPSULE | Freq: Three times a day (TID) | ORAL | Status: DC
Start: 2015-11-03 — End: 2015-11-03
  Filled 2015-11-03 (×6): qty 1

## 2015-11-03 MED ORDER — ACETAMINOPHEN 650 MG RE SUPP
650.0000 mg | RECTAL | Status: DC | PRN
Start: 2015-11-03 — End: 2015-11-03

## 2015-11-03 MED ORDER — TRAMADOL HCL 50 MG PO TABS
50.0000 mg | ORAL_TABLET | Freq: Four times a day (QID) | ORAL | Status: DC | PRN
Start: 2015-11-03 — End: 2015-11-03

## 2015-11-03 MED ORDER — VH BIO-K PLUS PROBIOTIC 50 BIL CFU CAPSULE
50.0000 | DELAYED_RELEASE_CAPSULE | Freq: Every day | ORAL | 0 refills | Status: DC
Start: 2015-11-03 — End: 2017-09-25

## 2015-11-03 MED ORDER — ONDANSETRON 4 MG PO TBDP
4.0000 mg | ORAL_TABLET | Freq: Three times a day (TID) | ORAL | Status: DC | PRN
Start: 2015-11-03 — End: 2015-11-03

## 2015-11-03 MED ORDER — KETOROLAC TROMETHAMINE 15 MG/ML IJ SOLN
15.0000 mg | Freq: Four times a day (QID) | INTRAMUSCULAR | Status: DC | PRN
Start: 2015-11-03 — End: 2015-11-03

## 2015-11-03 MED ORDER — DICYCLOMINE HCL 20 MG PO TABS
ORAL_TABLET | ORAL | Status: AC
Start: 2015-11-03 — End: ?
  Filled 2015-11-03: qty 1

## 2015-11-03 MED ORDER — VH BIO-K PLUS PROBIOTIC 50 BIL CFU CAPSULE
50.0000 | DELAYED_RELEASE_CAPSULE | Freq: Every day | ORAL | Status: DC
Start: 2015-11-03 — End: 2015-11-03
  Administered 2015-11-03: 50 via ORAL
  Filled 2015-11-03: qty 1

## 2015-11-03 MED ORDER — METRONIDAZOLE 500 MG PO TABS
500.0000 mg | ORAL_TABLET | Freq: Two times a day (BID) | ORAL | 0 refills | Status: DC
Start: 2015-11-03 — End: 2017-06-08

## 2015-11-03 MED ORDER — ACETAMINOPHEN 160 MG/5ML PO SOLN
650.0000 mg | ORAL | Status: DC | PRN
Start: 2015-11-03 — End: 2015-11-03

## 2015-11-03 MED ORDER — DICYCLOMINE HCL 20 MG PO TABS
20.0000 mg | ORAL_TABLET | Freq: Once | ORAL | Status: AC
Start: 2015-11-03 — End: 2015-11-03
  Administered 2015-11-03: 20 mg via ORAL

## 2015-11-03 MED ORDER — VH HYDROMORPHONE HCL PF 1 MG/ML CARPUJECT
1.0000 mg | Freq: Once | INTRAMUSCULAR | Status: DC
Start: 2015-11-03 — End: 2015-11-03

## 2015-11-03 MED ORDER — CIPROFLOXACIN IN D5W 400 MG/200ML IV SOLN
400.0000 mg | Freq: Two times a day (BID) | INTRAVENOUS | Status: DC
Start: 2015-11-03 — End: 2015-11-03
  Administered 2015-11-03: 400 mg via INTRAVENOUS
  Filled 2015-11-03: qty 200

## 2015-11-03 MED ORDER — METRONIDAZOLE 250 MG PO TABS
500.0000 mg | ORAL_TABLET | Freq: Three times a day (TID) | ORAL | Status: DC
Start: 2015-11-03 — End: 2015-11-03
  Administered 2015-11-03: 500 mg via ORAL
  Filled 2015-11-03 (×4): qty 2

## 2015-11-03 MED ORDER — CIPROFLOXACIN HCL 500 MG PO TABS
500.0000 mg | ORAL_TABLET | Freq: Two times a day (BID) | ORAL | 0 refills | Status: AC
Start: 2015-11-03 — End: 2015-11-08

## 2015-11-03 MED ORDER — SODIUM CHLORIDE 0.9 % IJ SOLN
0.4000 mg | INTRAMUSCULAR | Status: DC | PRN
Start: 2015-11-03 — End: 2015-11-03

## 2015-11-03 MED ORDER — ENOXAPARIN SODIUM 40 MG/0.4ML SC SOLN
40.0000 mg | SUBCUTANEOUS | Status: DC
Start: 2015-11-03 — End: 2015-11-03
  Administered 2015-11-03: 40 mg via SUBCUTANEOUS
  Filled 2015-11-03: qty 0.4

## 2015-11-03 MED ORDER — ONDANSETRON HCL 4 MG/2ML IJ SOLN
4.0000 mg | Freq: Three times a day (TID) | INTRAMUSCULAR | Status: DC | PRN
Start: 2015-11-03 — End: 2015-11-03

## 2015-11-03 MED ORDER — TRAMADOL HCL 50 MG PO TABS
50.0000 mg | ORAL_TABLET | Freq: Four times a day (QID) | ORAL | 0 refills | Status: AC | PRN
Start: 2015-11-03 — End: 2015-11-06

## 2015-11-03 MED ORDER — SODIUM CHLORIDE 0.9 % IV SOLN
INTRAVENOUS | Status: DC
Start: 2015-11-03 — End: 2015-11-03

## 2015-11-03 MED ORDER — DICYCLOMINE HCL 10 MG PO CAPS
10.0000 mg | ORAL_CAPSULE | Freq: Four times a day (QID) | ORAL | Status: DC
Start: 2015-11-03 — End: 2015-11-03
  Administered 2015-11-03 (×2): 10 mg via ORAL
  Filled 2015-11-03 (×2): qty 1

## 2015-11-03 MED ORDER — ACETAMINOPHEN 325 MG PO TABS
650.0000 mg | ORAL_TABLET | ORAL | Status: DC | PRN
Start: 2015-11-03 — End: 2015-11-03
  Administered 2015-11-03: 650 mg via ORAL
  Filled 2015-11-03: qty 2

## 2015-11-03 NOTE — Plan of Care (Signed)
Problem: Altered GI Function  Goal: Elimination patterns are normal or improving  Outcome: Progressing  Pt denies N/V/D, c/o mild RLQ pain but states it is better than when it started

## 2015-11-03 NOTE — Consults (Signed)
GASTROENTEROLOGY CONSULTATION  [facility]    Date Time: 11/03/15 9:42 AM  Patient Name: Angel [name]  MRN#: [id number]  DOB: [birth year]  Requesting Physician: Dedra Skeens,*      Reason for Consultation:   We have been asked to evaluate this patient for colitis.     Assessment:     1. Pancolitis  2. Abdominal  Pain     Active Hospital Problems    Diagnosis   . Abdominal pain       Plan:   Angel [name] needs outpatient follow up with the GI clinic for further management of her colitis.  She has seen Garfield Cornea, NP, and Dr. Jamelle Haring.      Would like to initiate mesalamine but pt has had reaction to ASA previously (tachycardia), will discuss with Dr. San Morelle.      History:   Angel [name] is a 45 y.o. female who presents to the hospital on 11/02/2015 with abdominal pain.  A couple of weeks ago she had some diarrhea.  She has not had rectal bleeding.  She was diagnosed last year in June with pancolitis by colonoscopy.  She's also had a CT in 2011 showing apparent colitis.  CT this visit also showed colitis.  She is not on any maintenance medication and did not seek any follow up after her colonoscopy.  She denies diarrhea or rectal bleeding at the current time.  She has a decrease in appetite but has no nausea or vomiting.        Past Medical History:     Past Medical History:   Diagnosis Date   . Anxiety    . Bloody stools 08/07/2012   . Colon polyps    . Diarrhea    . Pancolitis    . Proctitis        Past Surgical History:     Past Surgical History:   Procedure Laterality Date   . COLONOSCOPY N/A 07/30/2014    Procedure: COLONOSCOPY;  Surgeon: Nolon Stalls, MD;  Location: [facility];  Service: Gastroenterology;  Laterality: N/A;       Family History:   History reviewed. No pertinent family history.    Social History:     Social History     Social History   . Marital status: Married     Spouse name: N/A   . Number of children: N/A   . Years of education: N/A      Social History Main Topics   . Smoking status: Never Smoker   . Smokeless tobacco: Never Used   . Alcohol use 3.6 oz/week     6 Cans of beer per week   . Drug use: No   . Sexual activity: Not on file     Other Topics Concern   . Not on file     Social History Narrative   . No narrative on file       Allergies:     Allergies   Allergen Reactions   . Asa [Aspirin] Palpitations   . Codeine Nausea And Vomiting       Medications:     Current Facility-Administered Medications   Medication Dose Route Frequency   . ciprofloxacin  400 mg Intravenous Q12H   . dicyclomine  10 mg Oral TID AC & HS   . enoxaparin  40 mg Subcutaneous Q24H   . lactobacillus species  50 Billion CFU Oral Daily   . metroNIDAZOLE  500 mg Oral [facility]  Review of Systems:   A comprehensive review of systems was as follows:  History obtained from the patient and chart review    General ROS: negative for fever, chills, weight loss, or night sweats  ENT ROS: negative  Cardiovascular ROS: negative for palpitations, DOE, chest pain  Respiratory ROS: negative for SOB, cough  Gastrointestinal ROS: see HPI  Genito-Urinary ROS: negative  Endocrine ROS: negative   Psychological ROS: negative  Neurological ROS: negative  Dermatological ROS: negative for jaundice, itching, pallor    ROS as stated above, and otherwise negative.     Physical Exam:     Vitals:    11/03/15 0744   BP: 105/69   Pulse: (!) 57   Resp: 18   Temp: 98.1 F (36.7 C)   SpO2: 99%       Intake and Output Summary (Last 24 hours) at Date Time  No intake or output data in the 24 hours ending 11/03/15 0942    General - alert, in no distress, oriented to person, place, and time, acyanotic, in no respiratory distress  Mental status - alert, oriented to person, place, and time  Eyes - pupils equal and reactive, extraocular eye movements intact, sclera anicteric  Mouth - mucous membranes moist, pharynx normal without lesions and tongue normal  Chest - clear to auscultation anteriorly, no  wheezes, rales or rhonchi, symmetric air entry, no tachypnea, retractions or cyanosis  Heart - normal rate, regular rhythm, normal S1, S2, no murmurs, rubs, clicks or gallops  Abdomen - soft, nontender, nondistended, no masses or organomegaly; no rebound tenderness noted; normoactive bowel sounds; no abdominal bruits; no pulsatile masses  Rectal - deferred, not clinically indicated  Neurological - alert, oriented, normal speech, no focal neurologic deficits apparent  Extremities - peripheral pulses normal, no pedal edema  Skin - normal coloration and turgor, no rashes, no suspicious skin lesions noted    Labs Reviewed:     Results     Procedure Component Value Units Date/Time    Beta HCG, Qual, Serum [[id number]] Collected:  11/02/15 2059    Specimen:  Plasma Updated:  11/03/15 0429     BHCG Qual Negative    C Reactive Protein (CRP) [[id number]]  (Abnormal) Collected:  11/02/15 2059    Specimen:  Plasma Updated:  11/02/15 2337     C-Reactive Protein 0.86 (H) mg/dL     Sedimentation rate (ESR) [[id number]] Collected:  11/02/15 2059    Specimen:  Blood Updated:  11/02/15 2322     Sed Rate 9 mm/hr     Urinalysis w Microscopic and Culture if Indicated [[id number]] Collected:  11/02/15 2236    Specimen:  Urine, Random Updated:  11/02/15 2301     Color, UA Yellow     Clarity, UA Clear     Specific Gravity, UR 1.026     pH, Urine 5.0 pH      Protein, UR Negative mg/dL      Glucose, UA Negative mg/dL      Ketones UA Negative mg/dL      Bilirubin, UA Negative     Blood, UA Negative     Nitrite, UA Negative     Urobilinogen, UA Normal mg/dL      Leukocyte Esterase, UA Negative Leu/uL     Comprehensive metabolic panel [[id number]] Collected:  11/02/15 2059    Specimen:  Plasma Updated:  11/02/15 2153     Sodium 140 mMol/L      Potassium 4.0 mMol/L  Chloride 108 mMol/L      CO2 24.2 mMol/L      Calcium 9.5 mg/dL      Glucose 88 mg/dL      Creatinine 1.61 mg/dL      BUN 14 mg/dL      Protein, Total 7.6 gm/dL      Albumin 3.7  gm/dL      Alkaline Phosphatase 79 U/L      ALT 26 U/L      AST (SGOT) 20 U/L      Bilirubin, Total 0.4 mg/dL      Albumin/Globulin Ratio 0.95 Ratio      Anion Gap 11.8 mMol/L      BUN/Creatinine Ratio 16.7 Ratio      EGFR 85 mL/min/1.40m2      Osmolality Calc 279 mOsm/kg      Globulin 3.9 gm/dL     Lipase [[id number]] Collected:  11/02/15 2059    Specimen:  Plasma Updated:  11/02/15 2153     Lipase 20 U/L     CBC and differential [[id number]] Collected:  11/02/15 2059    Specimen:  Blood from Blood Updated:  11/02/15 2128     WBC 7.8 K/cmm      RBC 4.79 M/cmm      Hemoglobin 14.8 gm/dL      Hematocrit 95.6 %      MCV 93 fL      MCH 31 pg      MCHC 33 gm/dL      RDW 21.3 %      PLT CT 296 K/cmm      MPV 7.2 fL      NEUTROPHIL % 59.1 %      Lymphocytes 30.5 %      Monocytes 8.0 %      Eosinophils % 1.5 %      Basophils % 0.9 %      Neutrophils Absolute 4.6 K/cmm      Lymphocytes Absolute 2.4 K/cmm      Monocytes Absolute 0.6 K/cmm      Eosinophils Absolute 0.1 K/cmm      BASO Absolute 0.1 K/cmm           Labs reviewed.    Rads:   Radiological Procedure(s) reviewed.     Signed by: Standley Brooking, PA-C  [facility]  Pager [phone]

## 2015-11-03 NOTE — Plan of Care (Signed)
Problem: Altered GI Function  Goal: Fluid and electrolyte balance are achieved/maintained  Outcome: Progressing  IVF infusing per order.    Comments: Assumed care at 0700. Pt resting quietly in bed, denies any needs at this time. Will continue to monitor.

## 2015-11-03 NOTE — Discharge Summary (Signed)
[facility] OBSERVATION UNIT      Patient: Angel [name]  Admission Date: 11/02/2015   DOB: 24-Apr-1970  Discharge Date: 11/03/2015    MRN: [id number]  Discharge Attending: Dedra Skeens   Referring Physician: Clayborne Dana, PA  PCP: Clayborne Dana, [place]       DISCHARGE SUMMARY     Discharge Information   Admission Diagnosis:   Colitis  Abdominal pain     Discharge Diagnosis:   Patient Active Problem List    Diagnosis Date Noted   . Abdominal pain 11/03/2015   . Colitis 11/03/2015   . 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 History of adenomatous polyp of colon 07/29/2009        Discharge Medications:     Medication List      START taking these medications    ciprofloxacin 500 MG tablet  Commonly known as:  CIPRO  Take 1 tablet (500 mg total) by mouth 2 (two) times daily.for 5 days     lactobacillus species capsule  Commonly known as:  BIO-K PLUS  Take 1 capsule (50 Billion CFU total) by mouth daily.     metroNIDAZOLE 500 MG tablet  Commonly known as:  FLAGYL  Take 1 tablet (500 mg total) by mouth 2 (two) times daily.     traMADol 50 MG tablet  Commonly known as:  ULTRAM  Take 1 tablet (50 mg total) by mouth every 6 (six) hours as needed for Pain.for up to 3 days           Where to Get Your Medications      You can get these medications from any pharmacy    Bring a paper prescription for each of these medications   ciprofloxacin 500 MG tablet   lactobacillus species capsule   metroNIDAZOLE 500 MG tablet   traMADol 50 MG tablet             Hospital Course   Presentation History   45 year old female who is under the emergency department with right-sided abdominal pain and rectal pressure. CAT scan indicated patient had colitis and ascending colon and rectal area. Her last colonoscopy was 2016 she was thought to have mild ulcerative colitis was placed on Mesalamine.  In the observation unit patient has been on IV Cipro and oral Flagyl. Her pain is better this morning she's remained afebrile and is ready to  be discharged home. She'll follow-up with gastroenterology. She left the observation unit in stable condition.    Hospital Course (0 Days)   See above    Procedures/Imaging:   CT Abdomen Pelvis with IV Cont   Final Result   Interval decrease in the pericolonic inflammation involving the ascending colon with the residual or recurrent thickening and tiny pericolonic nodule which could indicate a mild degree of colitis. No appendicitis, bowel obstruction or perforation or    abscess.      Intrauterine contraceptive device.      ReadingStation:[id number]          Treatment Team:   Attending Provider: Dedra Skeens, MD  Consulting Physician: Rogelio Seen, MD       Progress Note/Physical Exam at Discharge   Vitals:    11/02/15 2002 11/03/15 0243 11/03/15 0744   BP: (!) 124/95 117/85 105/69   Pulse: 81 (!) 55 (!) 57   Resp: 16 18 18    Temp: 98.2 F (36.8 C) 97.7 F (36.5 C) 98.1 F (36.7 C)   TempSrc: Oral Oral Oral  SpO2: 100% 99% 99%   Weight: 92.8 kg (204 lb 9.4 oz)     Height: 1.676 m (5\' 6" )         Physical Examination: General appearance - alert, well appearing, and in no distress  Mental status - alert, oriented to person, place, and time  Eyes - pupils equal and reactive, extraocular eye movements intact  Chest - clear to auscultation, no wheezes, rales or rhonchi, symmetric air entry  Heart - normal rate, regular rhythm, normal S1, S2, no murmurs, rubs, clicks or gallops  Abdomen - mild tenderness in the right-sided abdominal area. No rebound or guarding.       Diagnostics     Stress Test Results: none    Labs/Studies Pending at Discharge: No    Last Labs     Recent Labs  Lab 11/02/15  2059   WBC 7.8   RBC 4.79   Hemoglobin 14.8   Hematocrit 44.6   MCV 93   PLT CT 296         Recent Labs  Lab 11/02/15  2059   Sodium 140   Potassium 4.0   Chloride 108   CO2 24.2   BUN 14   Creatinine 0.84   Glucose 88   Calcium 9.5        Patient Instructions   Discharge Diet: regular diet  Discharge Activity:   activity as tolerated    Follow Up Appointment:  Follow-up Information     Nolon Stalls, MD .    Specialties:  Gastroenterology, Internal Medicine  Contact information:  [address]  [address]  [place] [address]  [phone]                    Time spent examining patient, discussing with patient/family regarding hospital course, chart review, reconciling medications and discharge planning: 30 minutes.      Dedra Skeens, MD    11:25 AM 11/03/2015

## 2015-11-03 NOTE — Progress Notes (Signed)
Pt discharged to home, left in personal vehicle. AVS not printed, reviewed in computer with pt, will place copy in mail once able to print. Verified home address with patient. Copy of prescriptions reviewed and sent home with patient, understanding voiced. PIV removed, catheter intact.

## 2015-11-03 NOTE — UM Notes (Signed)
[facility] Utilization Management Review Sheet    NAME: Angel [name]  MR#: [id number]    CSN#: [id number]    ROOM: 2508/2508-A AGE: 45 y.o.    ADMIT DATE AND TIME: 11/02/2015 10:26 PM    PATIENT CLASS: OBSERVATION  11/03/15 @ 0232 ADMIT OBSERVATION-OBS UNIT    ATTENDING PHYSICIAN: Dedra Skeens,*  PAYOR:Payor: [employer] / Plan: [employer] PPO / Product Type: *No Product type* /     AUTH #: NPR    DIAGNOSIS:     ICD-10-CM    1. Pancolitis K51.00    2. RLQ abdominal pain R10.31      HISTORY:   Past Medical History:   Diagnosis Date   . Anxiety    . Bloody stools 08/07/2012   . Colon polyps    . Diarrhea    . Pancolitis    . Proctitis      DATE OF ED TREATMENT- 11/02/15  TREATMENT IN ED:  ZOFRAN IV, DILAUDID IV, IVF, BENTYL PO     OBSERVATION REVIEW    HPI: Presents with 2 days of abdominal pain and nausea.  Described as diffuse cramping in lower abdomen and RLQ " stabbing pain."     ABNORMAL LABS: CRP 0.86    DIAGNOSTIC TESTING: CT ABDOMEN PELVIS W IV CONT( DONE IN ED @ 0209) Interval decrease in the pericolonic inflammation involving the ascending colon with the residual or recurrent thickening and tiny pericolonic nodule which could indicate a mild degree of colitis. No appendicitis, bowel obstruction or perforation or   abscess.    VS: Blood pressure 117/85, pulse (!) 55, temperature 97.7 F (36.5 C), temperature source Oral, resp. rate 18, height 1.676 m, weight 92.8 kg, last menstrual period 10/31/2015, SpO2 99 %.    ABNORMAL FINDINGS: RLQ tenderness    ASSESSMENT AND PLAN:  ABDOMINAL PAIN/COLITIS  PLAN: IVF, STOOL CX'S, ANTIEMETICS, REPEAT LABS, GI CONSULT    OBSERVATION ORDERS   IVF @ 100, CIPRO 400 IV Q 12, BENTYL 10 PO QID, LOVENOX SQ QD, BIO-K PLUS QD, STOOL STUDIES, VS Q 4 HRS, CONTINUOUS PULSE OX, GI CONSULT    VS:  BP 113/70   Pulse (!) 58   Temp 98.6 F (37 C) (Oral)   Resp 18   Ht 1.676 m (5\' 6" )   Wt 92.8 kg (204 lb 9.4 oz)   LMP 10/31/2015   SpO2 96%   BMI 33.02 kg/m     DATE OF  REVIEW: 11/03/2015    Wende Mott, RN  Utilization Management  Case Management Department    [facility]  [address]  [place], [place] [address]  T [phone]    F [phone]   [email or web address]

## 2015-11-03 NOTE — Progress Notes (Signed)
Copy of AVS printed and placed in envelope in outgoing mail.

## 2015-11-03 NOTE — H&P (Signed)
[facility]  OBSERVATION UNIT  HISTORY AND PHYSICAL       Patient: Angel [name]  Admission Date: 11/02/2015    DOB: [birth year]  Age: 45 y.o.    MRN: [id number]  Sex: female    PCP: Clayborne Dana, PA  Attending: Dedra Skeens,*         HISTORY OF PRESENT ILLNESS     Angel [name] is a 45 y.o. female who presented with abdominal pain and nausea for past 2 days.  Described as diffuse cramping in lower abdomen and "stabbing pain" in RLQ.  Some nausea, occasional diarrhea which alternates with formed stool.  She is menstruating.  She has an IUD.  Denies fever, chills, cough, HA, dizziness,  edema, rash. No extremity pain or weakness.  Denies recent illness or travel.     Pt has had extensive abdominal workups for hx of pancolitis, proctitis and diarrhea including CT, upper and lower endoscopy.  CT today in ED reveals a pancolitis which is improving.  CBC, CMP and urinalysis WNL.  Lipase normal.     The patient has agreed to transfer to the Observation Unit for completion of cardiac workup with Stress Testing in the morning.  She has received prior to admission to the Observation Unit with some reported relief.        ASSESSMENT & PLAN     Angel [name] is a 45 y.o. female admitted under OBSERVATION with abdominal pain, colitis.     Assessment & Plan:  Patient will be admitted to Observation Unit for rehydration and clinical management of GI symptoms.  1.  IVF  2.  Stool cultures   3.  Add on bHcg  4.  Anti-emetics  5.  Repeat Labs AM  6.  Monitor and Treat patients clinical Sx as necessary  7.  GI to see in AM, Dr. Rhetta Mura contacted by ED attending.  Please call in AM for consult.       PAST MEDICAL HISTORY     Code Status: No Order    Past Medical History:   Diagnosis Date   . Anxiety    . Bloody stools 08/07/2012   . Colon polyps    . Diarrhea    . Pancolitis    . Proctitis        Past Surgical History:   Procedure Laterality Date   . COLONOSCOPY N/A 07/30/2014    Procedure:  COLONOSCOPY;  Surgeon: Nolon Stalls, MD;  [facility];  Service: Gastroenterology;  Laterality: N/A;       There are no discharge medications for this patient.      Allergies:   Allergies   Allergen Reactions   . Asa [Aspirin] Palpitations   . Codeine Nausea And Vomiting       History reviewed. No pertinent family history.    SHx:  reports that she has never smoked. She has never used smokeless tobacco. She reports that she drinks about 3.6 oz of alcohol per week . She reports that she does not use drugs.    REVIEW OF SYSTEMS     A complete 12 point review of systems was completed and was negative except as noted in the HPI.     PHYSICAL EXAM     Vital Signs: Blood pressure 117/85, pulse (!) 55, temperature 97.7 F (36.5 C), temperature source Oral, resp. rate 18, height 1.676 m (5\' 6" ), weight 92.8 kg (204 lb 9.4 oz), last menstrual period 10/31/2015, SpO2  99 %.    Physical Exam   Constitutional: She is oriented to person, place, and time. She appears well-developed and well-nourished.   Pt curled up in ED bed #17, hugging pillow, tearful.    HENT:   Head: Normocephalic.   Eyes: Conjunctivae are normal. Pupils are equal, round, and reactive to light.   Neck: Normal range of motion. Neck supple.   Cardiovascular: Normal rate, regular rhythm, normal heart sounds and intact distal pulses.    Pulmonary/Chest: Effort normal and breath sounds normal. No respiratory distress. She exhibits no tenderness.   Abdominal: Soft. Bowel sounds are normal. She exhibits no distension. There is generalized tenderness.   Musculoskeletal: Normal range of motion. She exhibits no edema or tenderness.   Neurological: She is alert and oriented to person, place, and time.   Skin: Skin is warm and dry. No rash noted.   Psychiatric: Her behavior is normal. She exhibits a depressed mood.   Flat affect       LABS & IMAGING     Labs:  Results for orders placed or performed during the hospital encounter of 11/02/15   CBC and  differential   Result Value Ref Range    WBC 7.8 4.0 - 11.0 K/cmm    RBC 4.79 3.80 - 5.00 M/cmm    Hemoglobin 14.8 12.0 - 16.0 gm/dL    Hematocrit 69.6 29.5 - 48.0 %    MCV 93 80 - 100 fL    MCH 31 28 - 35 pg    MCHC 33 32 - 36 gm/dL    RDW 28.4 13.2 - 44.0 %    PLT CT 296 130 - 440 K/cmm    MPV 7.2 6.0 - 10.0 fL    NEUTROPHIL % 59.1 42.0 - 78.0 %    Lymphocytes 30.5 15.0 - 46.0 %    Monocytes 8.0 3.0 - 15.0 %    Eosinophils % 1.5 0.0 - 7.0 %    Basophils % 0.9 0.0 - 3.0 %    Neutrophils Absolute 4.6 1.7 - 8.6 K/cmm    Lymphocytes Absolute 2.4 0.6 - 5.1 K/cmm    Monocytes Absolute 0.6 0.1 - 1.7 K/cmm    Eosinophils Absolute 0.1 0.0 - 0.8 K/cmm    BASO Absolute 0.1 0.0 - 0.3 K/cmm   Comprehensive metabolic panel   Result Value Ref Range    Sodium 140 136 - 147 mMol/L    Potassium 4.0 3.5 - 5.3 mMol/L    Chloride 108 98 - 110 mMol/L    CO2 24.2 20.0 - 30.0 mMol/L    Calcium 9.5 8.5 - 10.5 mg/dL    Glucose 88 70 - 99 mg/dL    Creatinine 1.02 7.25 - 1.20 mg/dL    BUN 14 7 - 22 mg/dL    Protein, Total 7.6 6.0 - 8.3 gm/dL    Albumin 3.7 3.5 - 5.0 gm/dL    Alkaline Phosphatase 79 40 - 145 U/L    ALT 26 0 - 55 U/L    AST (SGOT) 20 10 - 42 U/L    Bilirubin, Total 0.4 0.1 - 1.2 mg/dL    Albumin/Globulin Ratio 0.95 0.70 - 1.50 Ratio    Anion Gap 11.8 7.0 - 18.0 mMol/L    BUN/Creatinine Ratio 16.7 10.0 - 30.0 Ratio    EGFR 85 60 - 150 mL/min/1.48m2    Osmolality Calc 279 275 - 300 mOsm/kg    Globulin 3.9 2.0 - 4.0 gm/dL   Lipase   Result Value  Ref Range    Lipase 20 8 - 78 U/L   Urinalysis w Microscopic and Culture if Indicated   Result Value Ref Range    Color, UA Yellow Colorless,Yellow,Straw    Clarity, UA Clear Clear    Specific Gravity, UR 1.026 1.001 - 1.040    pH, Urine 5.0 5.0 - 8.0 pH    Protein, UR Negative Negative mg/dL    Glucose, UA Negative Negative mg/dL    Ketones UA Negative Negative,5 mg/dL    Bilirubin, UA Negative Negative    Blood, UA Negative Negative    Nitrite, UA Negative Negative    Urobilinogen, UA  Normal Normal mg/dL    Leukocyte Esterase, UA Negative Negative Leu/uL   C Reactive Protein (CRP)   Result Value Ref Range    C-Reactive Protein 0.86 (H) 0.02 - 0.80 mg/dL   Sedimentation rate (ESR)   Result Value Ref Range    Sed Rate 9 0 - 20 mm/hr       Imaging:  CT Abdomen Pelvis with IV Cont   Final Result   Interval decrease in the pericolonic inflammation involving the ascending colon with the residual or recurrent thickening and tiny pericolonic nodule which could indicate a mild degree of colitis. No appendicitis, bowel obstruction or perforation or    abscess.      Intrauterine contraceptive device.      ReadingStation:[id number]           EMERGENCY DEPARTMENT COURSE     ED Medication Orders     Start Ordered     Status Ordering Provider    11/03/15 0156 11/03/15 0155  dicyclomine (BENTYL) tablet 20 mg  Once in ED     Route: Oral  Ordered Dose: 20 mg     Last MAR action:  Given Emeterio Reeve    11/03/15 0156 11/03/15 0155  HYDROmorphone (DILAUDID) injection 1 mg  Once in ED     Route: Intravenous  Ordered Dose: 1 mg     Acknowledged Emeterio Reeve    11/02/15 2302 11/02/15 2301  sodium chloride 0.9 % bolus 500 mL  Once in ED     Route: Intravenous  Ordered Dose: 500 mL     Last MAR action:  [employer]    11/02/15 2302 11/02/15 2301  0.9%  NaCl infusion  Continuous     Route: Intravenous     Last MAR action:  [address] [place] R    11/02/15 2301 11/02/15 2300  ondansetron (ZOFRAN) injection 4 mg  Once in ED     Route: Intravenous  Ordered Dose: 4 mg     Last MAR action:  Given Emeterio Reeve    11/02/15 2301 11/02/15 2300  HYDROmorphone (DILAUDID) injection 1 mg  Once in ED     Route: Intravenous  Ordered Dose: 1 mg     Last MAR action:  Given Emeterio Reeve          ED documentation including nurses notes were reviewed by myself.  The case was discussed with the ED Attending.        I certify the need for admission to the Observation Unit based on the patient's history and the  information above.    Glade Stanford Tryone Kille, PA   11/03/2015 3:30 AM

## 2016-01-07 ENCOUNTER — Other Ambulatory Visit
Admission: RE | Admit: 2016-01-07 | Discharge: 2016-01-07 | Disposition: A | Discharge status: Home / Self Care | Payer: BC Managed Care – PPO | Source: Ambulatory Visit | Attending: Gastroenterology | Admitting: Gastroenterology

## 2016-01-07 LAB — CBC AND DIFFERENTIAL
Basophils %: 0.2 % (ref 0.0–3.0)
Basophils Absolute: 0 10*3/uL (ref 0.0–0.3)
Eosinophils %: 1.5 % (ref 0.0–7.0)
Eosinophils Absolute: 0.1 10*3/uL (ref 0.0–0.8)
Hematocrit: 44.3 % (ref 36.0–48.0)
Hemoglobin: 15 gm/dL (ref 12.0–16.0)
Lymphocytes Absolute: 2.2 10*3/uL (ref 0.6–5.1)
Lymphocytes: 31.5 % (ref 15.0–46.0)
MCH: 31 pg (ref 28–35)
MCHC: 34 gm/dL (ref 32–36)
MCV: 92 fL (ref 80–100)
MPV: 7.6 fL (ref 6.0–10.0)
Monocytes Absolute: 0.4 10*3/uL (ref 0.1–1.7)
Monocytes: 6.4 % (ref 3.0–15.0)
Neutrophils %: 60.3 % (ref 42.0–78.0)
Neutrophils Absolute: 4.1 10*3/uL (ref 1.7–8.6)
PLT CT: 271 10*3/uL (ref 130–440)
RBC: 4.84 10*6/uL (ref 3.80–5.00)
RDW: 11.5 % (ref 11.0–14.0)
WBC: 6.8 10*3/uL (ref 4.0–11.0)

## 2016-01-07 LAB — COMPREHENSIVE METABOLIC PANEL
ALT: 64 U/L — ABNORMAL HIGH (ref 0–55)
AST (SGOT): 57 U/L — ABNORMAL HIGH (ref 10–42)
Albumin/Globulin Ratio: 1.19 Ratio (ref 0.70–1.50)
Albumin: 3.8 gm/dL (ref 3.5–5.0)
Alkaline Phosphatase: 80 U/L (ref 40–145)
Anion Gap: 11.7 mMol/L (ref 7.0–18.0)
BUN / Creatinine Ratio: 19 Ratio (ref 10.0–30.0)
BUN: 15 mg/dL (ref 7–22)
Bilirubin, Total: 0.6 mg/dL (ref 0.1–1.2)
CO2: 27.6 mMol/L (ref 20.0–30.0)
Calcium: 9.5 mg/dL (ref 8.5–10.5)
Chloride: 105 mMol/L (ref 98–110)
Creatinine: 0.79 mg/dL (ref 0.60–1.20)
EGFR: 91 mL/min/{1.73_m2} (ref 60–150)
Globulin: 3.2 gm/dL (ref 2.0–4.0)
Glucose: 84 mg/dL (ref 70–99)
Osmolality Calc: 279 mOsm/kg (ref 275–300)
Potassium: 4.3 mMol/L (ref 3.5–5.3)
Protein, Total: 7 gm/dL (ref 6.0–8.3)
Sodium: 140 mMol/L (ref 136–147)

## 2016-01-18 ENCOUNTER — Encounter: Payer: Self-pay | Admitting: Gastroenterology

## 2016-01-18 DIAGNOSIS — R945 Abnormal results of liver function studies: Secondary | ICD-10-CM

## 2016-01-26 ENCOUNTER — Ambulatory Visit (INDEPENDENT_AMBULATORY_CARE_PROVIDER_SITE_OTHER)
Admission: RE | Admit: 2016-01-26 | Discharge: 2016-01-26 | Disposition: A | Discharge status: Home / Self Care | Payer: BC Managed Care – PPO | Source: Ambulatory Visit | Attending: Gastroenterology | Admitting: Gastroenterology

## 2016-01-26 DIAGNOSIS — R945 Abnormal results of liver function studies: Secondary | ICD-10-CM

## 2016-02-19 ENCOUNTER — Other Ambulatory Visit
Admission: RE | Admit: 2016-02-19 | Discharge: 2016-02-19 | Disposition: A | Discharge status: Home / Self Care | Payer: BC Managed Care – PPO | Source: Ambulatory Visit | Attending: Gastroenterology | Admitting: Gastroenterology

## 2016-02-19 LAB — HEPATIC FUNCTION PANEL
ALT: 29 U/L (ref 0–55)
AST (SGOT): 25 U/L (ref 10–42)
Albumin/Globulin Ratio: 1.15 Ratio (ref 0.70–1.50)
Albumin: 3.8 gm/dL (ref 3.5–5.0)
Alkaline Phosphatase: 79 U/L (ref 40–145)
Bilirubin Direct: 0.1 mg/dL (ref 0.0–0.3)
Bilirubin, Total: 0.4 mg/dL (ref 0.1–1.2)
Globulin: 3.3 gm/dL (ref 2.0–4.0)
Protein, Total: 7.1 gm/dL (ref 6.0–8.3)

## 2017-01-20 ENCOUNTER — Other Ambulatory Visit
Admission: RE | Admit: 2017-01-20 | Discharge: 2017-01-20 | Disposition: A | Discharge status: Home / Self Care | Payer: BC Managed Care – PPO | Source: Ambulatory Visit | Attending: Gastroenterology | Admitting: Gastroenterology

## 2017-01-20 LAB — HEPATIC FUNCTION PANEL
ALT: 19 U/L (ref 0–55)
AST (SGOT): 19 U/L (ref 10–42)
Albumin/Globulin Ratio: 1.31 Ratio (ref 0.70–1.50)
Albumin: 3.8 gm/dL (ref 3.5–5.0)
Alkaline Phosphatase: 72 U/L (ref 40–145)
Bilirubin Direct: 0.2 mg/dL (ref 0.0–0.3)
Bilirubin, Total: 0.6 mg/dL (ref 0.1–1.2)
Globulin: 2.9 gm/dL (ref 2.0–4.0)
Protein, Total: 6.7 gm/dL (ref 6.0–8.3)

## 2017-07-25 ENCOUNTER — Ambulatory Visit (INDEPENDENT_AMBULATORY_CARE_PROVIDER_SITE_OTHER)
Admission: RE | Admit: 2017-07-25 | Discharge: 2017-07-25 | Disposition: A | Discharge status: Home / Self Care | Payer: BC Managed Care – PPO | Source: Ambulatory Visit | Attending: Nurse Practitioner | Admitting: Nurse Practitioner

## 2017-07-25 ENCOUNTER — Encounter: Payer: Self-pay | Admitting: Nurse Practitioner

## 2017-07-25 DIAGNOSIS — I83892 Varicose veins of left lower extremities with other complications: Secondary | ICD-10-CM

## 2017-07-25 DIAGNOSIS — M79605 Pain in left leg: Secondary | ICD-10-CM

## 2017-08-02 ENCOUNTER — Encounter: Payer: Self-pay | Admitting: Nurse Practitioner

## 2017-08-02 DIAGNOSIS — I868 Varicose veins of other specified sites: Secondary | ICD-10-CM

## 2017-08-02 DIAGNOSIS — Z6832 Body mass index (BMI) 32.0-32.9, adult: Secondary | ICD-10-CM

## 2017-08-21 ENCOUNTER — Ambulatory Visit
Admission: RE | Admit: 2017-08-21 | Discharge: 2017-08-21 | Disposition: A | Discharge status: Home / Self Care | Payer: BC Managed Care – PPO | Source: Ambulatory Visit | Attending: Nurse Practitioner | Admitting: Nurse Practitioner

## 2017-08-21 DIAGNOSIS — I868 Varicose veins of other specified sites: Secondary | ICD-10-CM

## 2017-08-21 DIAGNOSIS — I872 Venous insufficiency (chronic) (peripheral): Secondary | ICD-10-CM | POA: Insufficient documentation

## 2017-08-21 DIAGNOSIS — Z6832 Body mass index (BMI) 32.0-32.9, adult: Secondary | ICD-10-CM | POA: Insufficient documentation

## 2017-09-25 ENCOUNTER — Encounter (INDEPENDENT_AMBULATORY_CARE_PROVIDER_SITE_OTHER): Payer: Self-pay | Admitting: Vascular Surgery

## 2017-09-25 ENCOUNTER — Ambulatory Visit (INDEPENDENT_AMBULATORY_CARE_PROVIDER_SITE_OTHER): Payer: BC Managed Care – PPO | Admitting: Vascular Surgery

## 2017-09-25 DIAGNOSIS — I8311 Varicose veins of right lower extremity with inflammation: Secondary | ICD-10-CM

## 2017-09-25 DIAGNOSIS — I8312 Varicose veins of left lower extremity with inflammation: Secondary | ICD-10-CM

## 2017-09-25 DIAGNOSIS — I872 Venous insufficiency (chronic) (peripheral): Secondary | ICD-10-CM | POA: Insufficient documentation

## 2017-09-25 NOTE — Progress Notes (Signed)
I have reviewed and agree with the documentation of the LPN / MA below.  ______________________________________________________________________   Review of Systems (positives are checked)    CONSTITUTIONAL  []  weight loss    []  chronic fatigue    INTEGUMENTARY  []  rashes or ulcerations   []  skin changes    []  gangrene    HEENT  []  visual change   []  loss of vision   []  diplopia   []  amaurosis     []  tinnitus         []  hearing loss    RESPIRATORY  []  cough   []  hemoptysis    []  night sweats    []  fever  []  chills   CARDIOVASCULAR  []  chest pain     []  dyspnea     []  orthopnea     []  palpitations  []  not able to climb a flight of stairs    GI  []  abdominal pain  []  nausea   []  vomiting  []  diarrhea    []  constipation  []  changes in appetite    GU  []  frequency  []  incontinence     []  erectile dysfunction (female only)      HEMATOLOGIC / LYMPHATIC  []  bleeding or clotting disorder  []  history of DVT             []  history of lymphedema  MUSCULOSKELETAL  [x]  varicose veins  [x]  claudication     ENDOCRINE   []  diabetes     []  thyroid disorder      NEUROLOGIC   []  CVA    []  TIA    []  syncope    []  memory loss    []  confusion    ALLERGIC  []  dye allergy  []  shellfish allergy  []  history of anaphylaxis    PSYCHIATRIC:    []  depression     []  anxiety   []  under psychiatric care     Roxana Hires, LPN  4/0/1027 25:36 PM

## 2017-09-25 NOTE — Progress Notes (Signed)
VASCULAR SURGERY OFFICE VISIT  NEW PATIENT    Date Time: 09/25/2017 1:38 PM  Patient Name: Angel [name],Angel [name]  MRN#: [id number]  DOB: Jan 11, 1971  Requesting Physician: Faylene Million      Reason for Consultation:   Bilateral lower extremity varicose veins    History of Present Illness:   Angel [name] is a 47 y.o. female who presents to the office today with left anterior knee varicose vein for the last year, now associated with burning sensations, achy pain, and inflammation for the last month and a half.  She denies obvious use of compression stockings.  She does not have significant symptoms in her right lower extremity.  She works at [employer] as a part-time 2-3 times per week, on her feet for 8 hours/day.  She denies symptoms suggestive of claudication or rest pain.  She had venous insufficiency study done by her PCP, and was found to have bilateral great saphenous vein reflux.    Vascular Surgical History:   None    Past Medical History:     Past Medical History:   Diagnosis Date   . Anxiety    . Bloody stools 08/07/2012   . Colon polyps    . Diarrhea    . Pancolitis    . Proctitis        Past Surgical History:     Past Surgical History:   Procedure Laterality Date   . COLONOSCOPY N/A 07/30/2014    Procedure: COLONOSCOPY;  Surgeon: Nolon Stalls, MD;  [facility];  Service: Gastroenterology;  Laterality: N/A;       Family History:   History reviewed. No pertinent family history.    Social History:   No history of tobacco abuse.    Allergies:     Allergies   Allergen Reactions   . Asa [Aspirin] Palpitations   . Codeine Nausea And Vomiting       Medications:   MESALAMINE PO    Review of Systems:   Constitutional:  No weight loss, chronic fatigue/malaise, fever/chills or night sweats.  Integumentary:  No rashes, ulcerations or skin changes (ie. CVI).  HEENT:  No visual changes, loss of vision, amaurosis fugax  Cardiovascular: No chest pain, dyspnea a, orthopnea, palpitations.   METS  >4.  Respiratory:  No cough, hemoptysis  GI:  No abdominal pain, nausea, vomiting, diarrhea, or constipation   GU:  No frequency or incontinence.  Musculoskeletal:  (+) varicose veins, claudication.  Hematologic/lymphatic:  No bleeding/clotting disorders, or history of DVT  Endocrine:  No thyroid disorders or diabetes.    Neuro:  No stroke/TIA, syncope,   memory loss or confusion.  Allergic/immunologic:  No known dye allergies.      Physical Exam:     Vitals:    09/25/17 1255   BP: 126/78   Pulse: 85   SpO2: 98%     General: No acute distress  Eye: pupils equal bilaterally  HEENT: No asymmetry, atraumatic  Neck: no JVD, no carotid bruits noted bilaterally  Lungs: clear to auscultation, inspiratory effort adequate   Heart: regular rhythm and rate  Abdomen:  Soft, non-distended, non-tender to palpation, no abdominal aortic pulsating mass palpable  Extremities:  No edema, no lipodermatosclerosis, no deformities, (+) Varicose veins over the knee joint bilaterally, more prominent on the left than the right side.  (+) Varicose veins along the course of left lower leg GSV.    Pulse/Doppler exam:  Palpable radial, femoral, DP and PT pulses bilaterally.  Neuro: No focal neurologic deficits in the extremities  Psych:  normal affect, oriented x 3, memory appears intact  Skin: no ulcerations or skin changes to suggest chronic venous insufficiency       Labs Reviewed:   01/21/2016  GFR 91    Vascular Imaging Studies:   08/21/2017 Venous insufficiency study  1. No evidence of bilateral lower extremity deep vein thrombosis.   2. Moderate right great saphenous vein valvular incompetency of the saphenofemoral junction.   3. Severe right great saphenous vein valvular incompetency in the proximal thigh.   4. Moderate right great saphenous vein valvular incompetence in the proximal calf.   5. Severe left great saphenous vein valvular incompetency at the saphenofemoral junction.   6. Severe left great saphenous vein valvular incompetency  in the proximal thigh and calf.     Assessment:   Bilateral great saphenous venous insufficiency, associated with symptomatic varicose veins in the left lower extremity with pain and inflammation.    Plan:   I discussed at length pathophysiology of venous reflux and varicose veins, and indications for non-operative management with compression stockings prior to considering ablation therapy.  A prescription for thigh-high compression stockings 20-50mmHg was given to the patient to be worn during the day time.  She will followup in 6weeks for reevaluation.         Signed by: Bayard Males DO

## 2017-11-06 ENCOUNTER — Encounter (INDEPENDENT_AMBULATORY_CARE_PROVIDER_SITE_OTHER): Payer: Self-pay | Admitting: Vascular Surgery

## 2017-11-06 ENCOUNTER — Telehealth (INDEPENDENT_AMBULATORY_CARE_PROVIDER_SITE_OTHER): Payer: Self-pay

## 2017-11-06 ENCOUNTER — Ambulatory Visit (INDEPENDENT_AMBULATORY_CARE_PROVIDER_SITE_OTHER): Payer: BC Managed Care – PPO | Admitting: Vascular Surgery

## 2017-11-06 VITALS — BP 124/76 | HR 80 | Wt 203.6 lb

## 2017-11-06 DIAGNOSIS — I872 Venous insufficiency (chronic) (peripheral): Secondary | ICD-10-CM

## 2017-11-06 DIAGNOSIS — I8312 Varicose veins of left lower extremity with inflammation: Secondary | ICD-10-CM

## 2017-11-06 DIAGNOSIS — I8311 Varicose veins of right lower extremity with inflammation: Secondary | ICD-10-CM

## 2017-11-06 NOTE — Telephone Encounter (Signed)
-----   Message from Derwood Kaplan sent at 11/06/2017 11:46 AM EDT -----  Regarding: Closure  Hello,     Angel [name]   LGSV 12/13/17  Dr. Selena Batten     FU [place] needed   Compression - Yes  Xanax - No     Thank  You,

## 2017-11-06 NOTE — Progress Notes (Signed)
Follow Up - Left leg varicose veins with pain    Subjective:     [name] was seen today in the office for follow up of her lower extremity varicose veins with pain.  She reports persistent symptoms of pain and swelling over the left leg varicose veins and calf with compression stockings since her last visit.  He reports her symptoms do interfere with her daily life.    Anticoagulation/Antiplatelet therapy:   None    Physical Exam:     Vitals:    11/06/17 1119   BP: 124/76   Pulse: 80   SpO2: 98%       Weight: 92.4 kg (203 lb 9.6 oz) Body mass index is 33.88 kg/m.     Extremities: feet pink, warm. No ulcerations or gangrene.  Varicose veins in the left anterior thigh over the knee joint and anterior shin.      Vascular imaging studies:   Lower extremity venous insufficiency study 08/21/2017  1. No evidence of bilateral lower extremity deep vein thrombosis.   2. Moderate right great saphenous vein valvular incompetency of the saphenofemoral junction.   3. Severe right great saphenous vein valvular incompetency in the proximal thigh.   4. Moderate right great saphenous vein valvular incompetence in the proximal calf.   5. Severe left great saphenous vein valvular incompetency at the saphenofemoral junction.   6. Severe left great saphenous vein valvular incompetency in the proximal thigh and calf.      Assessment:   Bilateral lower extremity great saphenous venous insufficiency  Left lower extremity varicose veins associated with pain, failed nonoperative management.    Plan:   I discussed her options of continuing nonoperative management versus ablation therapy of left great saphenous vein in the thigh.  I discussed the details of the procedure, and risks associated with the procedure which include but not limited to bleeding, infection, nerve injury, deep venous thrombosis, recurrence, skin burn from the ablation therapy.  I also discussed the possibility of requiring surgical phlebectomy of her  varicose veins in the left thigh and anterior shin if her symptoms persist post great saphenous vein ablation therapy.  The patient expressed understanding and wishes to proceed with left great saphenous vein ablation therapy.    Signed by: Bayard Males, DO

## 2017-12-13 ENCOUNTER — Ambulatory Visit (INDEPENDENT_AMBULATORY_CARE_PROVIDER_SITE_OTHER): Payer: BC Managed Care – PPO | Admitting: Vascular Surgery

## 2017-12-13 DIAGNOSIS — I8312 Varicose veins of left lower extremity with inflammation: Secondary | ICD-10-CM

## 2017-12-13 NOTE — Progress Notes (Signed)
Date of Procedure: 12/13/2017    Indication: Varicose veins with complications    Procedure: VNUS closure of the LEFT great saphenous vein    Details of procedure: The patient had the left great saphenous vein mapped pre-procedure. The leg was prepped and draped in a surgical fashion. I accessed the left great saphenous vein in the proximal calf under ultrasound guidance with micropuncture technique and local anesthesia. The 7-French ClosureFAST catheter was advanced to the saphenofemoral junction and pulled back 2 cm inferior to the junction. Tumescent local anesthetic was injected along the course of the greater saphenous vein in the thigh. The vein was the heated to 120 degrees Centigrade. The proximal segment was treated twice. The remaining segments were treated once down to the puncture site. The catheter was removed. Manual pressure was applied. Skin was approximated using Steri-Strips and Ace bandages were applied. There were no adverse events.     SIGNED BY: Antionette Fairy.O.

## 2017-12-15 ENCOUNTER — Ambulatory Visit
Admission: RE | Admit: 2017-12-15 | Discharge: 2017-12-15 | Disposition: A | Discharge status: Home / Self Care | Payer: BC Managed Care – PPO | Source: Ambulatory Visit | Attending: Vascular Surgery | Admitting: Vascular Surgery

## 2017-12-15 DIAGNOSIS — I872 Venous insufficiency (chronic) (peripheral): Secondary | ICD-10-CM | POA: Insufficient documentation

## 2018-01-03 ENCOUNTER — Encounter (INDEPENDENT_AMBULATORY_CARE_PROVIDER_SITE_OTHER): Payer: Self-pay | Admitting: Vascular Surgery

## 2018-01-03 ENCOUNTER — Ambulatory Visit (INDEPENDENT_AMBULATORY_CARE_PROVIDER_SITE_OTHER): Payer: BC Managed Care – PPO | Admitting: Vascular Surgery

## 2018-01-03 VITALS — HR 73 | Wt 206.0 lb

## 2018-01-03 DIAGNOSIS — I872 Venous insufficiency (chronic) (peripheral): Secondary | ICD-10-CM

## 2018-01-03 DIAGNOSIS — I8312 Varicose veins of left lower extremity with inflammation: Secondary | ICD-10-CM

## 2018-01-03 DIAGNOSIS — I8311 Varicose veins of right lower extremity with inflammation: Secondary | ICD-10-CM

## 2018-01-05 ENCOUNTER — Encounter (INDEPENDENT_AMBULATORY_CARE_PROVIDER_SITE_OTHER): Payer: Self-pay | Admitting: Vascular Surgery

## 2018-01-05 NOTE — Progress Notes (Signed)
Follow Up - s/p left GSV VNUS    Subjective:     [name] was seen today in the office for follow up of her left GSV VNUS therapy. She reports achy pain at the venous access site and medial thigh which have been improving.  She reports resolution of her symptoms over the left anterior thigh pain over the varicose veins.    Vascular Surgical History:   Bilateral great saphenous vein insufficiency -traumatic in the left lower extremity   12/13/2017 left great saphenous vein radiofrequency ablation therapy    Anticoagulation/Antiplatelet therapy:   None    Physical Exam:     Vitals:    01/03/18 1042   Pulse: 73   SpO2: 99%       Weight: 93.4 kg (206 lb) Body mass index is 34.28 kg/m.     Extremities: No edema    Vascular imaging studies:   Venous duplex left lower extremity 12/15/2017  1.  Noncompressible left thigh great great saphenous vein after endovenous closure procedure.   2.  Patent left common femoral vein without evidence of endovenous heat induced thrombosis.      Assessment:   Bilateral lower extremity great saphenous vein insufficiency symptomatic in the left lower extremity  Status post left great saphenous vein ablation therapy in the thig.  No post-procedure complications.    Plan:   Continue to wear compression stockings for symptom relief.  Follow-up..    Signed by: Bayard Males, DO

## 2018-04-10 ENCOUNTER — Encounter: Payer: Self-pay | Admitting: Nurse Practitioner

## 2018-04-10 DIAGNOSIS — R002 Palpitations: Secondary | ICD-10-CM

## 2018-04-10 DIAGNOSIS — R079 Chest pain, unspecified: Secondary | ICD-10-CM

## 2018-04-19 ENCOUNTER — Ambulatory Visit
Admission: RE | Admit: 2018-04-19 | Discharge: 2018-04-19 | Disposition: A | Discharge status: Home / Self Care | Payer: BC Managed Care – PPO | Source: Ambulatory Visit | Attending: Nurse Practitioner | Admitting: Nurse Practitioner

## 2018-04-19 DIAGNOSIS — R002 Palpitations: Secondary | ICD-10-CM

## 2018-04-19 DIAGNOSIS — R079 Chest pain, unspecified: Secondary | ICD-10-CM

## 2018-04-19 DIAGNOSIS — R000 Tachycardia, unspecified: Secondary | ICD-10-CM | POA: Insufficient documentation

## 2018-09-19 ENCOUNTER — Other Ambulatory Visit: Payer: Self-pay | Admitting: Sports Medicine

## 2018-09-19 ENCOUNTER — Ambulatory Visit (INDEPENDENT_AMBULATORY_CARE_PROVIDER_SITE_OTHER): Payer: Self-pay | Admitting: Sports Medicine

## 2018-09-19 ENCOUNTER — Encounter: Payer: Self-pay | Admitting: Sports Medicine

## 2018-09-19 ENCOUNTER — Ambulatory Visit (INDEPENDENT_AMBULATORY_CARE_PROVIDER_SITE_OTHER): Payer: Self-pay

## 2018-09-19 ENCOUNTER — Other Ambulatory Visit: Payer: Self-pay

## 2018-09-19 VITALS — Temp 98.6°F | Resp 16

## 2018-09-19 DIAGNOSIS — M25872 Other specified joint disorders, left ankle and foot: Secondary | ICD-10-CM

## 2018-09-19 DIAGNOSIS — M79672 Pain in left foot: Secondary | ICD-10-CM

## 2018-09-19 DIAGNOSIS — M779 Enthesopathy, unspecified: Secondary | ICD-10-CM

## 2018-09-19 DIAGNOSIS — G588 Other specified mononeuropathies: Secondary | ICD-10-CM

## 2018-09-19 DIAGNOSIS — M7742 Metatarsalgia, left foot: Secondary | ICD-10-CM

## 2018-09-19 MED ORDER — TRIAMCINOLONE ACETONIDE 10 MG/ML IJ SUSP
10.0000 mg | Freq: Once | INTRAMUSCULAR | Status: AC
  Administered 2018-09-19: 10 mg

## 2018-09-19 MED ORDER — METHYLPREDNISOLONE 4 MG PO TBPK: ORAL_TABLET | ORAL | 0 refills | Status: DC

## 2018-09-19 NOTE — Progress Notes (Signed)
Subjective: [name] is a 48 y.o. female patient who presents to office for evaluation of left foot pain. Patient complains of progressive pain especially over the last 2 months that started back in May pain is 8 out of 10 tingling sharp worse with walking feels like she is walking on a stone specially to the ball of her foot pain is worse with barefoot or worse at the end of the day or night reports that she is on her feet a lot with her job and has tried massaging wrapping the toe with tape and Voltaren cream with no improvement.  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  Patient denies injury/trip/fall/sprain/any causative factors.   Review of Systems  Musculoskeletal: Positive for joint pain.  All other systems reviewed and are negative.   Patient Active Problem List   Diagnosis Date Noted  . Hyperparathyroidism, primary ([place]) 09/08/2010    Current Outpatient Medications on File Prior to Visit  Medication Sig Dispense Refill  . aspirin-acetaminophen-caffeine (EXCEDRIN MIGRAINE) 250-250-65 MG per tablet Take 2 tablets by mouth daily as needed for pain.    Marland Kitchen levothyroxine (SYNTHROID, LEVOTHROID) 137 MCG tablet Take 137 mcg by mouth daily before breakfast.    . losartan (COZAAR) 100 MG tablet TK 1 T PO QD    . medroxyPROGESTERone (PROVERA) 2.5 MG tablet TK 1 T PO  QD    . Multiple Vitamin (MULTIVITAMIN WITH MINERALS) TABS Take 1 tablet by mouth daily.    . Omega-3 Fatty Acids (FP FISH OIL PO) Take 2 capsules by mouth 3 (three) times daily with meals.     No current facility-administered medications on file prior to visit.     Allergies  Allergen Reactions  . Bacitracin Rash and Hives  . Bacitracin-Neomycin-Polymyxin Rash and Hives    Transcribed from previous EMR.  . Bacitracin-Polymyxin B Rash and Hives   Transcribed from previous EMR.  . Neosporin [Neomycin-Bacitracin Zn-Polymyx] Itching and Rash    Objective:  General: Alert and oriented x3 in no acute distress  Dermatology: No open lesions bilateral lower extremities, no webspace macerations, no ecchymosis bilateral, all nails x 10 are well manicured.  Vascular: Dorsalis Pedis and Posterior Tibial pedal pulses palpable, Capillary Fill Time 3 seconds,(+) pedal hair growth bilateral, no edema bilateral lower extremities, Temperature gradient within normal limits.  Neurology: Gross sensation intact via light touch bilateral, objective tingling and burning sensation to the left second toe worse with plantar flexion of the toe.  Musculoskeletal: Mild tenderness with palpation at plantar aspect of the second metatarsal phalangeal joint on the left there is no hammertoe deformities however there is mild suspicion that there could be early deformity happen possible early signs of pre-dislocation syndrome.  Strength within normal limits in all groups bilateral.   Gait: Antalgic gait  Xrays  Left foot   Impression: Structurally there is a long second metatarsal, mild elevation noted of the first metatarsal no other acute findings.  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 Post-injection care discussed with patient.  -Rx Medrol Dosepak to take as instructed and dispensed a cam boot for patient to use for at least 1 week if she is feeling well may slowly transition back to tennis shoe/walking sandal -Patient to return to office 2 to 3 weeks or sooner if condition worsens.  Asencion Islamitorya Zell Hylton, DPM

## 2018-09-21 ENCOUNTER — Other Ambulatory Visit: Payer: Self-pay | Admitting: Sports Medicine

## 2018-09-21 DIAGNOSIS — M79672 Pain in left foot: Secondary | ICD-10-CM

## 2018-09-21 DIAGNOSIS — M779 Enthesopathy, unspecified: Secondary | ICD-10-CM

## 2018-10-05 ENCOUNTER — Ambulatory Visit (INDEPENDENT_AMBULATORY_CARE_PROVIDER_SITE_OTHER): Payer: Managed Care, Other (non HMO) | Admitting: Sports Medicine

## 2018-10-05 ENCOUNTER — Other Ambulatory Visit: Payer: Self-pay

## 2018-10-05 ENCOUNTER — Encounter: Payer: Self-pay | Admitting: Sports Medicine

## 2018-10-05 VITALS — Temp 98.2°F | Resp 16

## 2018-10-05 DIAGNOSIS — M7742 Metatarsalgia, left foot: Secondary | ICD-10-CM | POA: Diagnosis not present

## 2018-10-05 DIAGNOSIS — M79672 Pain in left foot: Secondary | ICD-10-CM

## 2018-10-05 DIAGNOSIS — M25872 Other specified joint disorders, left ankle and foot: Secondary | ICD-10-CM | POA: Diagnosis not present

## 2018-10-05 DIAGNOSIS — M779 Enthesopathy, unspecified: Secondary | ICD-10-CM | POA: Diagnosis not present

## 2018-10-05 DIAGNOSIS — M722 Plantar fascial fibromatosis: Secondary | ICD-10-CM | POA: Diagnosis not present

## 2018-10-05 NOTE — Progress Notes (Signed)
Subjective: [name] is a 48 y.o. female patient who returns to office for follow-up evaluation of left foot pain patient reports that pain is doing a lot better when she is walking barefoot she can feel that there is still some discomfort pain is 3-4 out of 10 states that when she has on shoes there is no pain reports that she has been consistent with using her topical Voltaren gel icing and trying to wear good supportive shoes at all times.  Patient reports that she has completed her steroid by mouth and wore the cam boot for 1 week before she transition back to her normal shoe without any problems.  Patient denies any other constitutional symptoms or any other pedal complaints at this time.  Patient Active Problem List   Diagnosis Date Noted  . Hyperparathyroidism, primary ([place]) 09/08/2010    Current Outpatient Medications on File Prior to Visit  Medication Sig Dispense Refill  . aspirin-acetaminophen-caffeine (EXCEDRIN MIGRAINE) 250-250-65 MG per tablet Take 2 tablets by mouth daily as needed for pain.    Marland Kitchen levothyroxine (SYNTHROID) 125 MCG tablet TK 1 T PO  D    . levothyroxine (SYNTHROID, LEVOTHROID) 137 MCG tablet Take 137 mcg by mouth daily before breakfast.    . losartan (COZAAR) 100 MG tablet TK 1 T PO QD    . medroxyPROGESTERone (PROVERA) 2.5 MG tablet TK 1 T PO  QD    . methylPREDNISolone (MEDROL DOSEPAK) 4 MG TBPK tablet Take as directed 21 tablet 0  . Multiple Vitamin (MULTIVITAMIN WITH MINERALS) TABS Take 1 tablet by mouth daily.    . Omega-3 Fatty Acids (FP FISH OIL PO) Take 2 capsules by mouth 3 (three) times daily with meals.     No current facility-administered medications on file prior to visit.     Allergies  Allergen Reactions  . Bacitracin Rash and Hives  . Bacitracin-Neomycin-Polymyxin Rash and Hives    Transcribed from previous EMR.  . Bacitracin-Polymyxin B Rash and Hives    Transcribed from previous EMR.  . Neosporin [Neomycin-Bacitracin Zn-Polymyx]  Itching and Rash    Objective:  General: Alert and oriented x3 in no acute distress  Dermatology: No open lesions bilateral lower extremities, no webspace macerations, no ecchymosis bilateral, all nails x 10 are well manicured.  Vascular: Dorsalis Pedis and Posterior Tibial pedal pulses palpable, Capillary Fill Time 3 seconds,(+) pedal hair growth bilateral, no edema bilateral lower extremities, Temperature gradient within normal limits.  Neurology: Gross sensation intact via light touch bilateral, objective tingling and burning sensation to the left second toe worse with plantar flexion of the toe.  Musculoskeletal: Decreased tenderness with palpation at plantar aspect of the second metatarsal phalangeal joint on the left there is no hammertoe deformities however possible early second hammertoe versus distal plantar fasciitis.  Strength within normal limits in all groups bilateral.   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       Plantar fasciitis       distal on left   Left foot pain          -Complete examination performed -Re-Discussed treatement options for likely structural capsulitis secondary to increase of activity versus ligament outstretch/pre-dislocation syndrome versus distal plantar fasciitis -Advised patient to refrain from walking barefoot  and continue with good supportive shoes even when in house dispensed metatarsal padding for patient to use as instructed -Advised patient that she would benefit long-term from custom orthotics office to check benefits and to schedule if patient wants them -Advised patient that if pain starts to worsen to return to using cam boot and return to office for follow-up evaluation -Patient to return to office for orthotics or sooner if condition worsens.  Asencion Islamitorya  Sunita Demond, DPM

## 2018-11-07 ENCOUNTER — Telehealth: Payer: Self-pay | Admitting: *Deleted

## 2018-11-07 DIAGNOSIS — M7742 Metatarsalgia, left foot: Secondary | ICD-10-CM

## 2018-11-07 DIAGNOSIS — M25872 Other specified joint disorders, left ankle and foot: Secondary | ICD-10-CM

## 2018-11-07 DIAGNOSIS — M779 Enthesopathy, unspecified: Secondary | ICD-10-CM

## 2018-11-07 NOTE — Telephone Encounter (Signed)
Pt states she received a injection in her 2nd toe and was told she could get another at a later date and she would like an appt.

## 2018-11-09 ENCOUNTER — Ambulatory Visit (INDEPENDENT_AMBULATORY_CARE_PROVIDER_SITE_OTHER): Payer: Managed Care, Other (non HMO) | Admitting: Sports Medicine

## 2018-11-09 ENCOUNTER — Other Ambulatory Visit: Payer: Self-pay

## 2018-11-09 ENCOUNTER — Encounter: Payer: Self-pay | Admitting: Sports Medicine

## 2018-11-09 DIAGNOSIS — N951 Menopausal and female climacteric states: Secondary | ICD-10-CM | POA: Insufficient documentation

## 2018-11-09 DIAGNOSIS — R102 Pelvic and perineal pain: Secondary | ICD-10-CM | POA: Insufficient documentation

## 2018-11-09 DIAGNOSIS — N814 Uterovaginal prolapse, unspecified: Secondary | ICD-10-CM | POA: Insufficient documentation

## 2018-11-09 DIAGNOSIS — M722 Plantar fascial fibromatosis: Secondary | ICD-10-CM

## 2018-11-09 DIAGNOSIS — M779 Enthesopathy, unspecified: Secondary | ICD-10-CM | POA: Diagnosis not present

## 2018-11-09 DIAGNOSIS — M7742 Metatarsalgia, left foot: Secondary | ICD-10-CM

## 2018-11-09 DIAGNOSIS — S96912A Strain of unspecified muscle and tendon at ankle and foot level, left foot, initial encounter: Secondary | ICD-10-CM

## 2018-11-09 DIAGNOSIS — B373 Candidiasis of vulva and vagina: Secondary | ICD-10-CM | POA: Insufficient documentation

## 2018-11-09 DIAGNOSIS — G588 Other specified mononeuropathies: Secondary | ICD-10-CM

## 2018-11-09 DIAGNOSIS — M79672 Pain in left foot: Secondary | ICD-10-CM

## 2018-11-09 DIAGNOSIS — B3731 Acute candidiasis of vulva and vagina: Secondary | ICD-10-CM | POA: Insufficient documentation

## 2018-11-09 DIAGNOSIS — F419 Anxiety disorder, unspecified: Secondary | ICD-10-CM | POA: Insufficient documentation

## 2018-11-09 DIAGNOSIS — M25872 Other specified joint disorders, left ankle and foot: Secondary | ICD-10-CM

## 2018-11-09 DIAGNOSIS — E213 Hyperparathyroidism, unspecified: Secondary | ICD-10-CM

## 2018-11-09 MED ORDER — TRIAMCINOLONE ACETONIDE 10 MG/ML IJ SUSP: 10.0000 mg | Freq: Once | INTRAMUSCULAR | Status: DC

## 2018-11-09 MED ORDER — TRIAMCINOLONE ACETONIDE 10 MG/ML IJ SUSP
10.0000 mg | Freq: Once | INTRAMUSCULAR | Status: AC
  Administered 2018-11-09: 10 mg

## 2018-11-09 MED ORDER — METHYLPREDNISOLONE 4 MG PO TBPK: ORAL_TABLET | ORAL | 0 refills | Status: DC

## 2018-11-09 NOTE — Progress Notes (Signed)
Subjective: [name] is a 48 y.o. female patient who returns to office for follow-up evaluation of left foot pain patient reports that pain has come back and it is roughly 8 out of 10 to the left foot feels like she is stepping on a stone has been going on since this week reports that she has been doing a lot of activity where she has been up on the ball of her foot and had an issue where she trimmed her fourth toenail with a short and started to walk funny and then after that had a reoccurrence of pain in the ball of her left foot.  Patient reports that she has went back into her cam boot which seems to help however she cannot go without the boot due to the increased pain reports that she is concerned about what to do about this pain long-term because the injection helped greatly but now the pain is back.  Patient denies redness warmth bruising any direct trauma or injury to the affected area.  No other pedal complaints noted.  Patient Active Problem List   Diagnosis Date Noted  . Anxiety disorder 11/09/2018  . Candidal vulvovaginitis 11/09/2018  . Chronic anxiety 11/09/2018  . Pain in pelvis 11/09/2018  . Menopausal syndrome 11/09/2018  . Uterine prolapse 11/09/2018  . Chronic lymphocytic thyroiditis 11/26/2010  . Hyperparathyroidism, primary ([place]) 09/08/2010    Current Outpatient Medications on File Prior to Visit  Medication Sig Dispense Refill  . aspirin-acetaminophen-caffeine (EXCEDRIN MIGRAINE) 250-250-65 MG per tablet Take 2 tablets by mouth daily as needed for pain.    Marland Kitchen levothyroxine (SYNTHROID) 125 MCG tablet TK 1 T PO  D    . levothyroxine (SYNTHROID, LEVOTHROID) 137 MCG tablet Take 137 mcg by mouth daily before breakfast.    . losartan (COZAAR) 100 MG tablet TK 1 T PO QD    . medroxyPROGESTERone (PROVERA) 2.5 MG tablet TK 1 T PO  QD    . Multiple Vitamin (MULTIVITAMIN WITH MINERALS) TABS Take 1 tablet by mouth daily.    . Omega-3 Fatty Acids (FP FISH OIL PO) Take 2  capsules by mouth 3 (three) times daily with meals.     No current facility-administered medications on file prior to visit.     Allergies  Allergen Reactions  . Bacitracin Rash and Hives  . Bacitracin-Neomycin-Polymyxin Rash and Hives    Transcribed from previous EMR.  . Bacitracin-Polymyxin B Rash and Hives    Transcribed from previous EMR.  . Neosporin [Neomycin-Bacitracin Zn-Polymyx] Itching and Rash    Objective:  General: Alert and oriented x3 in no acute distress  Dermatology: No open lesions bilateral lower extremities, no webspace macerations, no ecchymosis bilateral, all nails x 10 are well manicured.  Vascular: Dorsalis Pedis and Posterior Tibial pedal pulses palpable, Capillary Fill Time 3 seconds,(+) pedal hair growth bilateral, no edema bilateral lower extremities, Temperature gradient within normal limits.  Neurology: Gross sensation intact via light touch bilateral, objective tingling and burning sensation to the left second toe worse with plantar flexion of the toe.  Musculoskeletal: There is tenderness with palpation at plantar aspect of the second metatarsal phalangeal joint on the left there is no hammertoe deformities however possible early second hammertoe versus distal plantar fasciitis.  Strength within normal limits in all groups bilateral.   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 Post-injection care discussed with patient.  -Advised patient to continue for at least 1 more week with cam boot -Ordered arthritic panel for further evaluation to see if there is any underlying contributory inflammatory conditions that could be adding to patient's symptoms -Advised patient to refrain from taking her boot off and walking barefoot around the house or doing repetitive activities that could stress the ball of her left foot -Ordered MRI to rule out any second metatarsal phalangeal joint pathology sprain versus tear at the joint -Recommend rest ice elevation and also refilled Medrol Dosepak for patient to take as instructed -Patient to return to office after MRI or sooner if condition worsens.  Landis Martins, DPM

## 2018-11-12 NOTE — Telephone Encounter (Signed)
-----   Message from Landis Martins, [place] sent at 11/09/2018 12:59 PM EDT ----- Regarding: MRI Left foot r/o out plantar plate tear at 2nd MTPJ

## 2018-11-13 ENCOUNTER — Telehealth: Payer: Self-pay | Admitting: *Deleted

## 2018-11-13 NOTE — Telephone Encounter (Signed)
[place] ([employer]) and spoke with Marlowe Kays and prior authorization is not required for a MRI. Lattie Haw

## 2018-11-13 NOTE — Telephone Encounter (Addendum)
[place] scheduled MRI left foot [id number] on 11/15/2018 arrive 4:45pm for 5:00pm imaging. Faxed to [facility].

## 2018-11-13 NOTE — Telephone Encounter (Addendum)
Unable to contact pt by home phone number. I informed pt of the 11/15/2018 appt with [facility] [phone].

## 2018-11-20 ENCOUNTER — Telehealth: Payer: Self-pay

## 2018-11-20 NOTE — Telephone Encounter (Signed)
Spoke to Pt about her MRI results,  Pt stated understanding of there results and that she would follow up with Dr. Cannon Kettle

## 2018-11-20 NOTE — Telephone Encounter (Signed)
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 Advised patient to continue with good supportive shoes and avoid activities that will continue to stress the ball of her foot. Patient should return to office for follow up to discuss treatment options and continued care -Dr. Cannon Kettle

## 2018-11-21 ENCOUNTER — Encounter: Payer: Self-pay | Admitting: Sports Medicine

## 2019-05-15 ENCOUNTER — Ambulatory Visit (INDEPENDENT_AMBULATORY_CARE_PROVIDER_SITE_OTHER): Payer: BC Managed Care – PPO

## 2019-10-14 ENCOUNTER — Encounter (HOSPITAL_BASED_OUTPATIENT_CLINIC_OR_DEPARTMENT_OTHER): Payer: Self-pay

## 2019-10-14 ENCOUNTER — Other Ambulatory Visit
Admission: RE | Admit: 2019-10-14 | Discharge: 2019-10-14 | Disposition: A | Discharge status: Home / Self Care | Payer: BC Managed Care – PPO | Source: Ambulatory Visit | Attending: Gastroenterology | Admitting: Gastroenterology

## 2019-10-14 ENCOUNTER — Other Ambulatory Visit
Admission: RE | Admit: 2019-10-14 | Discharge: 2019-10-14 | Disposition: A | Discharge status: Home / Self Care | Payer: BC Managed Care – PPO | Source: Ambulatory Visit

## 2019-10-14 ENCOUNTER — Ambulatory Visit (INDEPENDENT_AMBULATORY_CARE_PROVIDER_SITE_OTHER): Payer: BC Managed Care – PPO

## 2019-10-14 ENCOUNTER — Other Ambulatory Visit: Payer: Self-pay | Admitting: Gastroenterology

## 2019-10-14 DIAGNOSIS — Z86010 Personal history of colonic polyps: Secondary | ICD-10-CM | POA: Insufficient documentation

## 2019-10-14 DIAGNOSIS — K5100 Ulcerative (chronic) pancolitis without complications: Secondary | ICD-10-CM | POA: Insufficient documentation

## 2019-10-14 LAB — COMPREHENSIVE METABOLIC PANEL
ALT: 28 U/L (ref 0–55)
AST (SGOT): 26 U/L (ref 10–42)
Albumin/Globulin Ratio: 1.27 Ratio (ref 0.80–2.00)
Albumin: 3.8 gm/dL (ref 3.5–5.0)
Alkaline Phosphatase: 76 U/L (ref 40–145)
Anion Gap: 10.9 mMol/L (ref 7.0–18.0)
BUN / Creatinine Ratio: 13.9 Ratio (ref 10.0–30.0)
BUN: 11 mg/dL (ref 7–22)
Bilirubin, Total: 0.7 mg/dL (ref 0.1–1.2)
CO2: 26 mMol/L (ref 20–30)
Calcium: 8.8 mg/dL (ref 8.5–10.5)
Chloride: 106 mMol/L (ref 98–110)
Creatinine: 0.79 mg/dL (ref 0.60–1.20)
EGFR: 89 mL/min/{1.73_m2} (ref 60–150)
Globulin: 3 gm/dL (ref 2.0–4.0)
Glucose: 89 mg/dL (ref 71–99)
Osmolality Calculated: 276 mOsm/kg (ref 275–300)
Potassium: 3.9 mMol/L (ref 3.5–5.3)
Protein, Total: 6.8 gm/dL (ref 6.0–8.3)
Sodium: 139 mMol/L (ref 136–147)

## 2019-10-14 LAB — CBC AND DIFFERENTIAL
Basophils %: 0.6 % (ref 0.0–3.0)
Basophils Absolute: 0 10*3/uL (ref 0.0–0.3)
Eosinophils %: 2.2 % (ref 0.0–7.0)
Eosinophils Absolute: 0.2 10*3/uL (ref 0.0–0.8)
Hematocrit: 43.6 % (ref 36.0–48.0)
Hemoglobin: 14.1 gm/dL (ref 12.0–16.0)
Lymphocytes Absolute: 1.6 10*3/uL (ref 0.6–5.1)
Lymphocytes: 21.2 % (ref 15.0–46.0)
MCH: 30 pg (ref 28–35)
MCHC: 32 gm/dL (ref 32–36)
MCV: 92 fL (ref 80–100)
MPV: 7.3 fL (ref 6.0–10.0)
Monocytes Absolute: 0.6 10*3/uL (ref 0.1–1.7)
Monocytes: 7.8 % (ref 3.0–15.0)
Neutrophils %: 68.2 % (ref 42.0–78.0)
Neutrophils Absolute: 5.2 10*3/uL (ref 1.7–8.6)
PLT CT: 286 10*3/uL (ref 130–440)
RBC: 4.75 10*6/uL (ref 3.80–5.00)
RDW: 12.4 % (ref 11.0–14.0)
WBC: 7.6 10*3/uL (ref 4.0–11.0)

## 2019-10-14 LAB — C-REACTIVE PROTEIN: C-Reactive Protein: 1.14 mg/dL — ABNORMAL HIGH (ref 0.02–0.80)

## 2019-10-14 LAB — SEDIMENTATION RATE: Sed Rate: 6 mm/hr (ref 0–22)

## 2019-10-14 SURGERY — DONT USE, USE 1094-COLONOSCOPY, DIAGNOSTIC (SCREENING)
Anesthesia: Conscious Sedation | Site: Anus

## 2020-12-07 ENCOUNTER — Encounter: Payer: Self-pay | Admitting: Nurse Practitioner

## 2020-12-07 ENCOUNTER — Ambulatory Visit: Payer: No Typology Code available for payment source | Admitting: Family Medicine

## 2020-12-07 ENCOUNTER — Encounter: Payer: Self-pay | Admitting: Family Medicine

## 2020-12-07 ENCOUNTER — Other Ambulatory Visit: Payer: Self-pay

## 2020-12-07 VITALS — BP 118/68 | HR 72 | Temp 97.2°F | Ht 67.0 in | Wt 173.0 lb

## 2020-12-07 DIAGNOSIS — Z23 Encounter for immunization: Secondary | ICD-10-CM | POA: Diagnosis not present

## 2020-12-07 DIAGNOSIS — G43009 Migraine without aura, not intractable, without status migrainosus: Secondary | ICD-10-CM | POA: Insufficient documentation

## 2020-12-07 DIAGNOSIS — I10 Essential (primary) hypertension: Secondary | ICD-10-CM | POA: Insufficient documentation

## 2020-12-07 DIAGNOSIS — E210 Primary hyperparathyroidism: Secondary | ICD-10-CM

## 2020-12-07 DIAGNOSIS — Z1231 Encounter for screening mammogram for malignant neoplasm of breast: Secondary | ICD-10-CM

## 2020-12-07 NOTE — Progress Notes (Signed)
New Patient Office Visit  Subjective:  Patient ID: [name], female    DOB: 1970/12/07  Age: 50 y.o. MRN: [id number]  CC:  Chief Complaint  Patient presents with   Establish Care     HPI [name] presents for establish care has a hx of hypothyroidism currently taking synthroid 125 mcg qam, s/p hysterectomy in 2014 for endometriosis. Pt sees Dr. Chestine Spore for gynecology.  Pt has been on estrace 1 mg daily. Pt tried to come off of it earlier this year, but had terrible hot flashes. Migraines x several years controlled with Emgality. Was having break through headaches with aimovig. Bernita Raisin helped with the break through headaches. Has also taken rizatriptan in the past, and would help migraines, but made her nauseated and lightheaded.  Injectables have changed her life. She has migraines 2-3 times per month when taking aimovig, but has had none with emgallity. Prior to starting these injectables, she would have three times a week. Pt has eliminated triggers (scents, foods.)  Hypertension: Started on valsartan/hctz 160-12.5 mg once daily. Has not taken topamax, beta blocker, or CCB that she knows of, but she has hypertension.   Hypothyroidism: on synthroid 125 mcg once daily in am.   Past Medical History:  Diagnosis Date   Hx of Lyme disease    recently, tx x2 weeks with antibiotic   Hypertension    Hypothyroidism    PONV (postoperative nausea and vomiting)     Past Surgical History:  Procedure Laterality Date   DIAGNOSTIC LAPAROSCOPY     LAPAROSCOPIC ASSISTED VAGINAL HYSTERECTOMY N/A 09/04/2012   Procedure: LAPAROSCOPIC ASSISTED VAGINAL HYSTERECTOMY;  Surgeon: Miguel Aschoff, MD;  Location: [facility];  Service: Gynecology;  Laterality: N/A;   PARATHYROIDECTOMY     SALPINGOOPHORECTOMY Bilateral 09/04/2012   Procedure: BILATERAL SALPINGO OOPHORECTOMY;  Surgeon: Miguel Aschoff, MD;  Location: [facility];  Service: Gynecology;  Laterality: Bilateral;    Family History  Problem Relation  Age of Onset   Glaucoma Mother    Parkinson's disease Father    Scoliosis Father    Cancer Maternal Grandfather        kidney and brain   Cancer Paternal Grandmother        brain    Social History   Socioeconomic History   Marital status: Married    Spouse name: Shaelee Forni   Number of children: Not on file   Years of education: Not on file   Highest education level: Not on file  Occupational History   Occupation: Pet Boarding  Tobacco Use   Smoking status: Former   Smokeless tobacco: Never  Substance and Sexual Activity   Alcohol use: Yes    Comment: rarely-wine   Drug use: No   Sexual activity: Not on file  Other Topics Concern   Not on file  Social History Narrative   Not on file   Social Determinants of Health   Financial Resource Strain: Not on file  Food Insecurity: Not on file  Transportation Needs: Not on file  Physical Activity: Not on file  Stress: Not on file  Social Connections: Not on file  Intimate Partner Violence: Not on file    ROS Review of Systems  Constitutional:  Negative for chills, fatigue and fever.  HENT:  Negative for congestion, ear pain, rhinorrhea and sore throat.   Respiratory:  Negative for cough and shortness of breath.   Cardiovascular:  Negative for chest pain.  Gastrointestinal:  Negative for abdominal pain, constipation, diarrhea,  nausea and vomiting.  Genitourinary:  Negative for dysuria and urgency.  Musculoskeletal:  Negative for back pain and myalgias.  Neurological:  Negative for dizziness, weakness, light-headedness and headaches.  Psychiatric/Behavioral:  Negative for dysphoric mood. The patient is not nervous/anxious.    Objective:   Today's Vitals: BP 118/68   Pulse 72   Temp (!) 97.2 F (36.2 C)   Ht 5\' 7"  (1.702 m)   Wt 173 lb (78.5 kg)   LMP 05/12/2010   SpO2 98%   BMI 27.10 kg/m   Physical Exam Vitals reviewed.  Constitutional:      Appearance: Normal appearance. She is normal weight.  Neck:      Vascular: No carotid bruit.  Cardiovascular:     Rate and Rhythm: Normal rate and regular rhythm.     Heart sounds: Normal heart sounds.  Pulmonary:     Effort: Pulmonary effort is normal. No respiratory distress.     Breath sounds: Normal breath sounds.  Abdominal:     General: Abdomen is flat. Bowel sounds are normal.     Palpations: Abdomen is soft.     Tenderness: There is no abdominal tenderness.  Musculoskeletal:        General: Tenderness (right knee medially tender. no ligament laxity.) present.  Neurological:     Mental Status: She is alert and oriented to person, place, and time.  Psychiatric:        Mood and Affect: Mood normal.        Behavior: Behavior normal.    Assessment & Plan:   Problem List Items Addressed This Visit       Cardiovascular and Mediastinum   Primary hypertension - Primary    The current medical regimen is effective;  continue present plan and medications. Continue valsartan/hctz 160/12.5 mg once daily.      Relevant Medications   valsartan-hydrochlorothiazide (DIOVAN-HCT) 160-12.5 MG tablet   Other Relevant Orders   CBC with Differential/Platelet (Completed)   Comprehensive metabolic panel (Completed)   Lipid panel (Completed)   TSH (Completed)   Migraine without aura and without status migrainosus, not intractable    Controlled with emgality. 05/14/2010 for acute migraines.      Relevant Medications   EMGALITY 120 MG/ML SOAJ   UBRELVY 50 MG TABS   valsartan-hydrochlorothiazide (DIOVAN-HCT) 160-12.5 MG tablet   Other Visit Diagnoses     Encounter for immunization       Relevant Orders   Bernita Raisin (Completed)   Need for immunization against influenza       Relevant Orders   Flu Vaccine QUAD 29mo+IM (Fluarix, Fluzone & Alfiuria Quad PF) (Completed)       Outpatient Encounter Medications as of 12/07/2020  Medication Sig   EMGALITY 120 MG/ML SOAJ Inject 1 mL into the skin every 30 (thirty) days.    estradiol (ESTRACE) 1 MG tablet Take 1 mg by mouth daily.   levothyroxine (SYNTHROID) 125 MCG tablet TK 1 T PO  D   Multiple Vitamin (MULTIVITAMIN WITH MINERALS) TABS Take 1 tablet by mouth daily.   ondansetron (ZOFRAN-ODT) 4 MG disintegrating tablet Take 4 mg by mouth every 8 (eight) hours as needed.   UBRELVY 50 MG TABS Take 50 mg by mouth.   valsartan-hydrochlorothiazide (DIOVAN-HCT) 160-12.5 MG tablet Take 1 tablet by mouth daily.   [DISCONTINUED] aspirin-acetaminophen-caffeine (EXCEDRIN MIGRAINE) 250-250-65 MG per tablet Take 2 tablets by mouth daily as needed for pain.   [DISCONTINUED] levothyroxine (SYNTHROID, LEVOTHROID) 137 MCG tablet Take 137  mcg by mouth daily before breakfast.   [DISCONTINUED] losartan (COZAAR) 100 MG tablet TK 1 T PO QD   [DISCONTINUED] medroxyPROGESTERone (PROVERA) 2.5 MG tablet TK 1 T PO  QD   [DISCONTINUED] methylPREDNISolone (MEDROL DOSEPAK) 4 MG TBPK tablet Take as directed   [DISCONTINUED] Omega-3 Fatty Acids (FP FISH OIL PO) Take 2 capsules by mouth 3 (three) times daily with meals.   No facility-administered encounter medications on file as of 12/07/2020.    Follow-up: Return in about 6 months (around 06/07/2021) for migraine follow up.   Blane Ohara, MD

## 2020-12-07 NOTE — Patient Instructions (Signed)
If you would like, we are able to do your annual gynecological physical. You are due end of March for this.

## 2020-12-08 LAB — LIPID PANEL
Chol/HDL Ratio: 3.1 ratio (ref 0.0–4.4)
Cholesterol, Total: 206 mg/dL — ABNORMAL HIGH (ref 100–199)
HDL: 67 mg/dL (ref >39)
LDL Chol Calc (NIH): 121 mg/dL — ABNORMAL HIGH (ref 0–99)
Triglycerides: 101 mg/dL (ref 0–149)
VLDL Cholesterol Cal: 18 mg/dL (ref 5–40)

## 2020-12-08 LAB — CBC WITH DIFFERENTIAL/PLATELET
Basophils Absolute: 0 10*3/uL (ref 0.0–0.2)
Basos: 0 %
EOS (ABSOLUTE): 0.1 10*3/uL (ref 0.0–0.4)
Eos: 2 %
Hematocrit: 39.7 % (ref 34.0–46.6)
Hemoglobin: 13.4 g/dL (ref 11.1–15.9)
Immature Grans (Abs): 0 10*3/uL (ref 0.0–0.1)
Immature Granulocytes: 0 %
Lymphocytes Absolute: 1.9 10*3/uL (ref 0.7–3.1)
Lymphs: 42 %
MCH: 29.8 pg (ref 26.6–33.0)
MCHC: 33.8 g/dL (ref 31.5–35.7)
MCV: 88 fL (ref 79–97)
Monocytes Absolute: 0.4 10*3/uL (ref 0.1–0.9)
Monocytes: 8 %
Neutrophils Absolute: 2.1 10*3/uL (ref 1.4–7.0)
Neutrophils: 48 %
Platelets: 235 10*3/uL (ref 150–450)
RBC: 4.5 x10E6/uL (ref 3.77–5.28)
RDW: 12.4 % (ref 11.7–15.4)
WBC: 4.5 10*3/uL (ref 3.4–10.8)

## 2020-12-08 LAB — COMPREHENSIVE METABOLIC PANEL
ALT: 16 IU/L (ref 0–32)
AST: 20 IU/L (ref 0–40)
Albumin/Globulin Ratio: 1.8 (ref 1.2–2.2)
Albumin: 4.8 g/dL (ref 3.8–4.8)
Alkaline Phosphatase: 53 IU/L (ref 44–121)
BUN/Creatinine Ratio: 17 (ref 9–23)
BUN: 11 mg/dL (ref 6–24)
Bilirubin Total: 0.6 mg/dL (ref 0.0–1.2)
CO2: 25 mmol/L (ref 20–29)
Calcium: 9.3 mg/dL (ref 8.7–10.2)
Chloride: 102 mmol/L (ref 96–106)
Creatinine, Ser: 0.66 mg/dL (ref 0.57–1.00)
Globulin, Total: 2.7 g/dL (ref 1.5–4.5)
Glucose: 84 mg/dL (ref 70–99)
Potassium: 4 mmol/L (ref 3.5–5.2)
Sodium: 141 mmol/L (ref 134–144)
Total Protein: 7.5 g/dL (ref 6.0–8.5)
eGFR: 107 mL/min/{1.73_m2} (ref >59)

## 2020-12-08 LAB — TSH: TSH: 0.921 u[IU]/mL (ref 0.450–4.500)

## 2020-12-08 NOTE — Progress Notes (Signed)
Blood count normal.  Liver function normal.  Kidney function normal.  Thyroid function normal.  Cholesterol: LDL 121. Little high. Low fat diet.

## 2020-12-21 ENCOUNTER — Encounter: Payer: Self-pay | Admitting: Family Medicine

## 2020-12-21 NOTE — Assessment & Plan Note (Signed)
Controlled with emgality. Bernita Raisin for acute migraines.

## 2020-12-21 NOTE — Assessment & Plan Note (Signed)
The current medical regimen is effective;  continue present plan and medications. Continue valsartan/hctz 160/12.5 mg once daily.

## 2021-01-08 ENCOUNTER — Other Ambulatory Visit: Payer: Self-pay | Admitting: Family Medicine

## 2021-01-08 MED ORDER — LEVOTHYROXINE SODIUM 125 MCG PO TABS: 125.0000 ug | ORAL_TABLET | Freq: Every day | ORAL | 0 refills | Status: DC

## 2021-02-17 ENCOUNTER — Other Ambulatory Visit: Payer: Self-pay

## 2021-02-17 MED ORDER — VALSARTAN-HYDROCHLOROTHIAZIDE 160-12.5 MG PO TABS: 1.0000 | ORAL_TABLET | Freq: Every day | ORAL | 0 refills | Status: DC

## 2021-02-24 ENCOUNTER — Ambulatory Visit
Admission: RE | Admit: 2021-02-24 | Discharge: 2021-02-24 | Disposition: A | Discharge status: Home / Self Care | Payer: BC Managed Care – PPO | Source: Ambulatory Visit | Attending: Nurse Practitioner | Admitting: Nurse Practitioner

## 2021-02-24 DIAGNOSIS — Z1231 Encounter for screening mammogram for malignant neoplasm of breast: Secondary | ICD-10-CM | POA: Insufficient documentation

## 2021-03-03 ENCOUNTER — Other Ambulatory Visit: Payer: Self-pay

## 2021-03-03 MED ORDER — EMGALITY 120 MG/ML ~~LOC~~ SOAJ: 1.0000 mL | SUBCUTANEOUS | 3 refills | Status: DC

## 2021-03-03 MED ORDER — LEVOTHYROXINE SODIUM 125 MCG PO TABS: 125.0000 ug | ORAL_TABLET | Freq: Every day | ORAL | 3 refills | Status: DC

## 2021-03-03 NOTE — Telephone Encounter (Signed)
Emgality has not been filled by Dr Tobie Poet before.   [name], [place] 03/03/21 10:04 AM

## 2021-04-23 ENCOUNTER — Other Ambulatory Visit: Payer: Self-pay | Admitting: Physician Assistant

## 2021-06-08 ENCOUNTER — Encounter: Payer: Self-pay | Admitting: Family Medicine

## 2021-06-08 ENCOUNTER — Ambulatory Visit: Payer: No Typology Code available for payment source | Admitting: Family Medicine

## 2021-06-08 VITALS — BP 104/64 | HR 76 | Temp 97.7°F | Resp 16 | Ht 67.0 in | Wt 174.0 lb

## 2021-06-08 DIAGNOSIS — Z1211 Encounter for screening for malignant neoplasm of colon: Secondary | ICD-10-CM | POA: Diagnosis not present

## 2021-06-08 DIAGNOSIS — I10 Essential (primary) hypertension: Secondary | ICD-10-CM | POA: Diagnosis not present

## 2021-06-08 DIAGNOSIS — Z1231 Encounter for screening mammogram for malignant neoplasm of breast: Secondary | ICD-10-CM

## 2021-06-08 DIAGNOSIS — N951 Menopausal and female climacteric states: Secondary | ICD-10-CM

## 2021-06-08 DIAGNOSIS — G43009 Migraine without aura, not intractable, without status migrainosus: Secondary | ICD-10-CM

## 2021-06-08 MED ORDER — VALSARTAN-HYDROCHLOROTHIAZIDE 160-12.5 MG PO TABS: 1.0000 | ORAL_TABLET | Freq: Every day | ORAL | 3 refills | Status: DC

## 2021-06-08 MED ORDER — EMGALITY 120 MG/ML ~~LOC~~ SOAJ: 1.0000 mL | SUBCUTANEOUS | 3 refills | Status: DC

## 2021-06-08 MED ORDER — ESTRADIOL 1 MG PO TABS: 1.0000 mg | ORAL_TABLET | Freq: Every day | ORAL | 3 refills | Status: DC

## 2021-06-08 MED ORDER — UBRELVY 50 MG PO TABS: ORAL_TABLET | ORAL | 3 refills | Status: DC

## 2021-06-08 NOTE — Progress Notes (Signed)
? ?Subjective:  ?Patient ID: [name], female    DOB: 10-12-1970  Age: 51 y.o. MRN: [id number] ? ?Chief Complaint  ?Patient presents with  ? Hypertension  ? ? ?HPI ?HYPERTENSION: on valsartan/hct 160/12.5 mg once daily.  ?Migraines: ON emgality 120 mg once monthly and ubrelvy for break thorugh headaches. ?Menopause: ON estrace 1 mg once daily.   ? ? ? ? ? ?Current Outpatient Medications on File Prior to Visit  ?Medication Sig Dispense Refill  ? levothyroxine (SYNTHROID) 125 MCG tablet Take 1 tablet (125 mcg total) by mouth daily before breakfast. 90 tablet 3  ? Multiple Vitamin (MULTIVITAMIN WITH MINERALS) TABS Take 1 tablet by mouth daily.    ? ondansetron (ZOFRAN-ODT) 4 MG disintegrating tablet Take 4 mg by mouth every 8 (eight) hours as needed.    ? ?No current facility-administered medications on file prior to visit.  ? ?Past Medical History:  ?Diagnosis Date  ? Hx of Lyme disease   ? recently, tx x2 weeks with antibiotic  ? Hypertension   ? Hypothyroidism   ? PONV (postoperative nausea and vomiting)   ? ?Past Surgical History:  ?Procedure Laterality Date  ? DIAGNOSTIC LAPAROSCOPY    ? LAPAROSCOPIC ASSISTED VAGINAL HYSTERECTOMY N/A 09/04/2012  ? Procedure: LAPAROSCOPIC ASSISTED VAGINAL HYSTERECTOMY;  Surgeon: Miguel Aschoff, MD;  Location: [facility];  Service: Gynecology;  Laterality: N/A;  ? PARATHYROIDECTOMY    ? SALPINGOOPHORECTOMY Bilateral 09/04/2012  ? Procedure: BILATERAL SALPINGO OOPHORECTOMY;  Surgeon: Miguel Aschoff, MD;  Location: [facility];  Service: Gynecology;  Laterality: Bilateral;  ?  ?Family History  ?Problem Relation Age of Onset  ? Glaucoma Mother   ? Parkinson's disease Father   ? Scoliosis Father   ? Cancer Maternal Grandfather   ?     kidney and brain  ? Cancer Paternal Grandmother   ?     brain  ? ?Social History  ? ?Socioeconomic History  ? Marital status: Married  ?  Spouse name: Reygan Heagle  ? Number of children: Not on file  ? Years of education: Not on file  ? Highest education level: Not on  file  ?Occupational History  ? Occupation: Naval architect  ?Tobacco Use  ? Smoking status: Former  ? Smokeless tobacco: Never  ?Substance and Sexual Activity  ? Alcohol use: Yes  ?  Comment: rarely-wine  ? Drug use: No  ? Sexual activity: Not on file  ?Other Topics Concern  ? Not on file  ?Social History Narrative  ? Not on file  ? ?Social Determinants of Health  ? ?Financial Resource Strain: Not on file  ?Food Insecurity: Not on file  ?Transportation Needs: Not on file  ?Physical Activity: Not on file  ?Stress: Not on file  ?Social Connections: Not on file  ? ? ?Review of Systems  ?Constitutional:  Negative for chills, fatigue and fever.  ?HENT:  Positive for tinnitus (intemittent). Negative for congestion, rhinorrhea and sore throat.   ?Respiratory:  Negative for cough and shortness of breath.   ?Cardiovascular:  Negative for chest pain.  ?Gastrointestinal:  Negative for abdominal pain, constipation, diarrhea, nausea and vomiting.  ?Genitourinary:  Negative for dysuria and urgency.  ?Musculoskeletal:  Negative for back pain and myalgias.  ?Neurological:  Positive for headaches (Migranes controlled with current medication regimen for the most part. She starts to have some break thorugh migraines in the week prior to her getting emgality). Negative for dizziness, weakness and light-headedness.  ?Psychiatric/Behavioral:  Negative for dysphoric mood. The patient is  not nervous/anxious.   ? ? ?Objective:  ?BP 104/64   Pulse 76   Temp 97.7 ?F (36.5 ?C)   Resp 16   Ht 5\' 7"  (1.702 m)   Wt 174 lb (78.9 kg)   LMP 05/12/2010   BMI 27.25 kg/m?  ? ? ?  06/08/2021  ?  1:28 PM 12/07/2020  ?  9:08 AM 09/05/2012  ? 10:00 AM  ?BP/Weight  ?Systolic BP 104 118 110  ?Diastolic BP 64 68 76  ?Wt. (Lbs) 174 173   ?BMI 27.25 kg/m2 27.1 kg/m2   ? ? ?Physical Exam ?Vitals reviewed.  ?Constitutional:   ?   Appearance: Normal appearance. She is normal weight.  ?Neck:  ?   Vascular: No carotid bruit.  ?Cardiovascular:  ?   Rate and Rhythm:  Normal rate and regular rhythm.  ?   Heart sounds: Normal heart sounds.  ?Pulmonary:  ?   Effort: Pulmonary effort is normal. No respiratory distress.  ?   Breath sounds: Normal breath sounds.  ?Abdominal:  ?   General: Abdomen is flat. Bowel sounds are normal.  ?   Palpations: Abdomen is soft.  ?   Tenderness: There is no abdominal tenderness.  ?Neurological:  ?   Mental Status: She is alert and oriented to person, place, and time.  ?Psychiatric:     ?   Mood and Affect: Mood normal.     ?   Behavior: Behavior normal.  ? ? ?Diabetic Foot Exam - Simple   ?No data filed ?  ?  ? ?Lab Results  ?Component Value Date  ? WBC 4.5 12/07/2020  ? HGB 13.4 12/07/2020  ? HCT 39.7 12/07/2020  ? PLT 235 12/07/2020  ? GLUCOSE 84 12/07/2020  ? CHOL 206 (H) 12/07/2020  ? TRIG 101 12/07/2020  ? HDL 67 12/07/2020  ? LDLCALC 121 (H) 12/07/2020  ? ALT 16 12/07/2020  ? AST 20 12/07/2020  ? NA 141 12/07/2020  ? K 4.0 12/07/2020  ? CL 102 12/07/2020  ? CREATININE 0.66 12/07/2020  ? BUN 11 12/07/2020  ? CO2 25 12/07/2020  ? TSH 0.921 12/07/2020  ? INR 1.02 08/06/2010  ? ? ? ? ?Assessment & Plan:  ? ?Problem List Items Addressed This Visit   ? ?  ? Cardiovascular and Mediastinum  ? Essential hypertension, benign - Primary  ?  Well controlled.  ?No changes to medicines.  ?Continue to work on eating a healthy diet and exercise.  ? ? ?  ?  ? Relevant Medications  ? valsartan-hydrochlorothiazide (DIOVAN-HCT) 160-12.5 MG tablet  ? Migraine without aura and without status migrainosus, not intractable  ?  May take emgality few days early. Sample given.  ? ?  ?  ? Relevant Medications  ? EMGALITY 120 MG/ML SOAJ  ? valsartan-hydrochlorothiazide (DIOVAN-HCT) 160-12.5 MG tablet  ? UBRELVY 50 MG TABS  ?  ? Other  ? Menopausal syndrome  ?  Continue estrace ? ?  ?  ? Screen for colon cancer  ? Relevant Orders  ? Cologuard  ? Visit for screening mammogram  ? Relevant Orders  ? MM DIGITAL SCREENING BILATERAL  ?. ? ?Meds ordered this encounter  ?Medications   ? EMGALITY 120 MG/ML SOAJ  ?  Sig: Inject 1 mL into the skin every 30 (thirty) days.  ?  Dispense:  3 mL  ?  Refill:  3  ? estradiol (ESTRACE) 1 MG tablet  ?  Sig: Take 1 tablet (1 mg total) by mouth daily.  ?  Dispense:  90 tablet  ?  Refill:  3  ? valsartan-hydrochlorothiazide (DIOVAN-HCT) 160-12.5 MG tablet  ?  Sig: Take 1 tablet by mouth daily.  ?  Dispense:  90 tablet  ?  Refill:  3  ? UBRELVY 50 MG TABS  ?  Sig: One at onset of migraine. Repeat in 2 hours x 1 as needed.  ?  Dispense:  30 tablet  ?  Refill:  3  ? ? ?Orders Placed This Encounter  ?Procedures  ? MM DIGITAL SCREENING BILATERAL  ? Cologuard  ?  ? ?Follow-up: Return in about 6 months (around 12/08/2021) for cpe fasting. ? ?An After Visit Summary was printed and given to the patient. ? ?Blane Ohara, MD ?[facility] ?[phone] ?

## 2021-06-08 NOTE — Assessment & Plan Note (Signed)
Well controlled. No changes to medicines.  Continue to work on eating a healthy diet and exercise.    

## 2021-06-08 NOTE — Assessment & Plan Note (Signed)
Continue estrace

## 2021-06-08 NOTE — Assessment & Plan Note (Signed)
May take emgality few days early. Sample given.  ?

## 2021-06-11 ENCOUNTER — Telehealth: Payer: Self-pay | Admitting: Family Medicine

## 2021-06-11 NOTE — Telephone Encounter (Signed)
? ?  [name] has been scheduled for the following appointment: ? ?WHAT: SCREENING MAMMOGRAM ?WHERE: [facility] ?DATE: 07/05/21 ?TIME: 5:15 PM ARRIVAL TIME ? ?A message has been left for the patient. ? ?

## 2021-07-14 ENCOUNTER — Ambulatory Visit
Admission: RE | Admit: 2021-07-14 | Discharge: 2021-07-14 | Disposition: A | Discharge status: Home / Self Care | Payer: No Typology Code available for payment source | Source: Ambulatory Visit | Attending: Family Medicine | Admitting: Family Medicine

## 2021-07-14 ENCOUNTER — Other Ambulatory Visit: Payer: Self-pay | Admitting: Family Medicine

## 2021-07-14 DIAGNOSIS — Z1231 Encounter for screening mammogram for malignant neoplasm of breast: Secondary | ICD-10-CM

## 2021-08-04 NOTE — Progress Notes (Signed)
GENETIC COUNSELING NOTE    Name: Angel [name]  MRN: [id number]  DOB: February 07, 1971  DOV: Telephone  Referring Provider: Fulton ReekStacy Humphreys, MD    Reason for Referral  I met with [name] today via telephone for genetic counseling and risk assessment. Angel [name] is a 51 yo female with family history of colon cancer and lynch syndrome. The patient was unaccompanied for today's visit.     Ginelle's past medical history was reviewed, including information from the electronic medical record as well as additional information provided by the patient.    Pertinent Family History  [name] reports [place] and [place] ancestry and denies any Ashkenazi Jewish ancestry. Family history is otherwise negative for cancer. Pertinent positives include:  Father with colon cancer and Lynch syndrome (MSH2 variant)  Paternal uncle with colon cancer x2    Cancer History  No history of colon cancer  Angel [name] reportedly has been having colonoscopies on a 3-year schedule due to her family history and a diagnosis of ulcerative colitis. She has reportedly had ~5 colonoscopies and had a total of 8-10 polyps. She is unsure of pathology but believes on her colonoscopy in 2017 there may have been 1-2 adenomas.      Cancer Surveillance  Colon screening: Most recent colonoscopy in 2020, 1-2 polyps removed by polypectomy, uncertain pathology. [name] was instructed to have a follow-up colonoscopy in 3 years. She is scheduled to have a colonoscopy this Fall.     Reproductive History  Age of Menarche: [age]  Age at first live birth: [age]  Age of Menopause: [age]  HRT: None    Assessment:  [name]'s family history is positive for an inherited cancer susceptibility. She meets the following NCCN criteria (Genetic/Familial High-Risk Assessment: Colon, v1.2023):  Family history of a known pathogenic variant of MSH2.    Given this elevated risk to carry a gene variant that predisposes to cancer, genetic testing for Angel [name] is indicated and was offered today. If she  were found to carry a gene variant that increases the risk for cancer, it will change her medical management.    Discussion  The genetics of cancer were reviewed. Approximately 5-10% of all cancer diagnoses have been associated with a strong inherited susceptibility involving a single germline gene variant resulting in a significant increase in risk for cancer. About 10-20% of all cancer diagnoses have been associated with a familial susceptibility, but likely involve the interaction of multiple genes along with environmental factors. The remaining 70-85% of cancers are thought to be sporadic with no strong hereditary component.    We reviewed the risks, benefits, and limitations of genetic testing for hereditary cancer syndromes. We discussed the implications of a positive result, a negative result, and a variant of uncertain significance (VUS) in her.    A positive result would mean that Angel [name] has inherited the pathogenic that was present in her father. This would confirm a diagnosis of a hereditary cancer syndrome in Hereditary Non-polyposis colorectal cancer (lynch syndrome). In this case we would recommend that [name] follow the appropriate medical management guidelines for that syndrome. We would also recommend that other family members be tested to determine whether they are also at an increased risk for certain cancers.  A negative result would mean that [name] did not inherit the pathogenic variant identified within the family. A negative result cannot definitively rule out a hereditary cancer syndrome, and heightened surveillance may still be necessary.  A VUS result would mean that a mutation was identified  but we do not currently have enough information to determine the significance of that particular gene change with certainty. VUS results may be reclassified as either pathogenic or benign in the future, so it is possible that our understanding of a VUS result and the clinical implications of  that result will change. Testing of unaffected family members or changing medical management recommendations is typically not recommended in the setting of a VUS.    These gene variants are mostly inherited in an autosomal dominant pattern, meaning that a parent who carries one of these gene mutations has a 50% chance to pass it down to each of their children. We reviewed the fact that having a hereditary cancer syndrome does not guarantee that you will develop cancer. We reviewed the rationale behind postponing genetic testing for adult-onset conditions in children, although exceptions would be made in situations where testing results would alter the child's healthcare. We reviewed her risk management options, which include heightened surveillance protocols, prophylactic surgeries like mastectomy or oophorectomies, and risk-reducing chemoprevention. We reviewed the importance of identifying the familial mutation in the appropriate proband prior to pursuing informative testing in unaffected, at-risk relatives.    We discussed the need to check prior authorization requirements before insurance billing as well as the self-pay pricing option of $250.    Due to the significant medical implications associated with the inheritance of one of these cancer-predisposing variants, we discussed the legal landscape with regards to protection against insurance discrimination.  The Genetic Information Nondiscrimination Act of 2008 (GINA) is a Freight forwarder that generally protects individuals from genetic discrimination in health insurance and employment. We discussed that this legal protection does not extend to life and long term care insurance.      Following our discussion of the risks, benefits, and limitations of genetic testing, Ms. Bonini expressed understanding of all of the above.    Plan   Patient meets criteria for genetic testing for hereditary cancer predisposition based on her family history of colon cancer and Lynch  Syndrome.    Patient elects to move forward with genetic testing (single site testing (if able to provide test report) or [facility] Concurrent from [facility]).   Our office will have a saliva collection kit shipped to the patient's home, and we should have results back 3 weeks after the lab receives the sample.   I will contact the patient by phone to review the results once they are in.     50 minutes of telephone time with the patient    Marybelle Killings, MS  Genetic Counselor  Oncology [facility]

## 2021-08-12 ENCOUNTER — Other Ambulatory Visit: Payer: Self-pay

## 2021-08-12 MED ORDER — EMGALITY 120 MG/ML ~~LOC~~ SOAJ: 1.0000 mL | SUBCUTANEOUS | 3 refills | Status: DC

## 2021-08-16 ENCOUNTER — Other Ambulatory Visit: Payer: Self-pay | Admitting: Family Medicine

## 2021-08-16 ENCOUNTER — Telehealth: Payer: Self-pay

## 2021-08-16 MED ORDER — PROPRANOLOL HCL ER 60 MG PO CP24: 60.0000 mg | ORAL_CAPSULE | Freq: Every day | ORAL | 2 refills | Status: DC

## 2021-08-16 NOTE — Telephone Encounter (Signed)
Patient called asking to try a medication for migraines and also help blood pressure. She said Emgality is not cover for her insurance and she discuss some alternative with you. She wants to try the medicine for blood pressure and maybe she can stop Diovan. Please advice.

## 2021-08-27 ENCOUNTER — Telehealth: Payer: Self-pay

## 2021-08-27 NOTE — Telephone Encounter (Signed)
Patient complaining of fatigue, swollen lymph nodes. Yesterday she woke up with swollen lymph nodes, throughout the day she realized her skin was sensitive and having chills. This morning she is fatigued and continues to have swollen lymph nodes.   Clinic is fully scheduled, recommended patient go to Urgent Care for evaluation. She VU.   Lorita Officer, [place] 08/27/21 9:49 AM

## 2021-10-11 ENCOUNTER — Telehealth: Payer: Self-pay

## 2021-10-11 ENCOUNTER — Other Ambulatory Visit: Payer: Self-pay

## 2021-10-11 DIAGNOSIS — I10 Essential (primary) hypertension: Secondary | ICD-10-CM

## 2021-10-11 MED ORDER — HYDROCHLOROTHIAZIDE 25 MG PO TABS: 25.0000 mg | ORAL_TABLET | Freq: Every day | ORAL | 1 refills | Status: DC

## 2021-10-11 NOTE — Telephone Encounter (Signed)
Patient was informed. Prescription was sent. 

## 2021-10-11 NOTE — Telephone Encounter (Signed)
Patient is taking Propranolol 60 mg daily. She was taking Valsartan-Hctz. She would like to continue with propranolol, but lately she noticed have some fluids and gain weight. She asked if can you add a fluid pill to her medication. BP today was 120/90  pulse 79.

## 2021-11-10 NOTE — Progress Notes (Signed)
Released negative genetic testing results (HNPCC/Lynch Syndrome). This is a reassuring result but cannot completely eliminate the possibility of a hereditary cancer syndrome. Given the pathogenic variant of MSH2 for Nelida's father that was reported, we would say that Samyria did not inherit the familial variant and her cancer risk is likely the same as the general populations. I would not recommend any additional genetic testing at this time. Following our discussion, the patient expressed understanding of all of the above and had no additional questions.  I will not make specific plans for follow-up but remain available to the patient and her physicians should future questions or concerns arise.

## 2021-11-13 ENCOUNTER — Other Ambulatory Visit: Payer: Self-pay | Admitting: Family Medicine

## 2021-12-09 ENCOUNTER — Encounter: Payer: No Typology Code available for payment source | Admitting: Family Medicine

## 2021-12-14 ENCOUNTER — Encounter: Payer: Self-pay | Admitting: Family Medicine

## 2021-12-14 ENCOUNTER — Ambulatory Visit
Admission: RE | Admit: 2021-12-14 | Discharge: 2021-12-14 | Disposition: A | Discharge status: Home / Self Care | Payer: No Typology Code available for payment source | Source: Ambulatory Visit | Attending: Family Medicine | Admitting: Family Medicine

## 2021-12-14 ENCOUNTER — Ambulatory Visit (INDEPENDENT_AMBULATORY_CARE_PROVIDER_SITE_OTHER): Payer: No Typology Code available for payment source | Admitting: Family Medicine

## 2021-12-14 VITALS — BP 116/78 | HR 78 | Temp 97.4°F | Resp 16 | Ht 67.0 in | Wt 184.0 lb

## 2021-12-14 DIAGNOSIS — S0083XA Contusion of other part of head, initial encounter: Secondary | ICD-10-CM

## 2021-12-14 DIAGNOSIS — R4182 Altered mental status, unspecified: Secondary | ICD-10-CM | POA: Diagnosis not present

## 2021-12-14 DIAGNOSIS — G4710 Hypersomnia, unspecified: Secondary | ICD-10-CM | POA: Diagnosis not present

## 2021-12-14 DIAGNOSIS — R41840 Attention and concentration deficit: Secondary | ICD-10-CM

## 2021-12-14 DIAGNOSIS — G43809 Other migraine, not intractable, without status migrainosus: Secondary | ICD-10-CM | POA: Diagnosis not present

## 2021-12-14 NOTE — Progress Notes (Signed)
Subjective:  Patient ID: [name], female    DOB: [birth year]  Age: 51 y.o. MRN: [id number]  Chief Complaint  Patient presents with   Head Injury    HPI  Patient was scheduled for her annual physical, but had significant complaints so converted to problem visit.  Head contusion 2 weeks ago: Misjudged landing on stairs in a new home. Hit head on wall and her hip and leg. Everything is back to normal except her husband says her mental status is not good. Difficult to concentrate. No dizziness, headaches. Did not seek medical care elsewhere. Patient has emotional lability. Very depressed.      12/25/2021    2:38 PM 12/14/2021   10:07 AM 12/07/2020    9:17 AM  PHQ9 SCORE ONLY  PHQ-9 Total Score 17 14 0       Social Hx   Social History   Socioeconomic History   Marital status: Married    Spouse name: Devery Odwyer   Number of children: Not on file   Years of education: Not on file   Highest education level: Not on file  Occupational History   Occupation: Pet Boarding  Tobacco Use   Smoking status: Former    Types: E-cigarettes   Smokeless tobacco: Never  Substance and Sexual Activity   Alcohol use: Not Currently    Comment: rarely-wine   Drug use: No   Sexual activity: Not on file  Other Topics Concern   Not on file  Social History Narrative   Not on file   Social Determinants of Health   Financial Resource Strain: Low Risk  (12/14/2021)   Overall Financial Resource Strain (CARDIA)    Difficulty of Paying Living Expenses: Not hard at all  Food Insecurity: No Food Insecurity (12/14/2021)   Hunger Vital Sign    Worried About Running Out of Food in the Last Year: Never true    Ran Out of Food in the Last Year: Never true  Transportation Needs: No Transportation Needs (12/14/2021)   PRAPARE - Administrator, Civil Service (Medical): No    Lack of Transportation (Non-Medical): No  Physical Activity: Sufficiently Active (12/14/2021)   Exercise Vital  Sign    Days of Exercise per Week: 7 days    Minutes of Exercise per Session: 60 min  Stress: No Stress Concern Present (12/14/2021)   [employer] of Occupational Health - Occupational Stress Questionnaire    Feeling of Stress : Only a little  Social Connections: Unknown (12/14/2021)   Social Connection and Isolation Panel [NHANES]    Frequency of Communication with Friends and Family: Not on file    Frequency of Social Gatherings with Friends and Family: Not on file    Attends Religious Services: Not on file    Active Member of Clubs or Organizations: Not on file    Attends Banker Meetings: Not on file    Marital Status: Married   Past Medical History:  Diagnosis Date   Hx of Lyme disease    recently, tx x2 weeks with antibiotic   Hypertension    Hypothyroidism    PONV (postoperative nausea and vomiting)    Past Surgical History:  Procedure Laterality Date   DIAGNOSTIC LAPAROSCOPY     LAPAROSCOPIC ASSISTED VAGINAL HYSTERECTOMY N/A 09/04/2012   Procedure: LAPAROSCOPIC ASSISTED VAGINAL HYSTERECTOMY;  Surgeon: Miguel Aschoff, MD;  Location: [facility];  Service: Gynecology;  Laterality: N/A;   PARATHYROIDECTOMY     SALPINGOOPHORECTOMY Bilateral 09/04/2012  Procedure: BILATERAL SALPINGO OOPHORECTOMY;  Surgeon: Miguel Aschoff, MD;  Location: [facility];  Service: Gynecology;  Laterality: Bilateral;    Family History  Problem Relation Age of Onset   Glaucoma Mother    Parkinson's disease Father    Scoliosis Father    Cancer Maternal Grandfather        kidney and brain   Cancer Paternal Grandmother        brain    Review of Systems  Constitutional:  Negative for chills, fatigue and fever.  HENT:  Positive for tinnitus. Negative for congestion, rhinorrhea and sore throat.   Respiratory:  Negative for cough and shortness of breath.   Cardiovascular:  Negative for chest pain.  Gastrointestinal:  Negative for abdominal pain, constipation, diarrhea, nausea and vomiting.   Genitourinary:  Negative for dysuria and urgency.  Musculoskeletal:  Positive for arthralgias (left knee and ankle pain). Negative for back pain and myalgias.  Neurological:  Positive for headaches. Negative for dizziness, weakness and light-headedness.  Psychiatric/Behavioral:  Positive for confusion, decreased concentration, dysphoric mood and sleep disturbance. Negative for suicidal ideas. The patient is nervous/anxious.      Objective:  BP 116/78   Pulse 78   Temp (!) 97.4 F (36.3 C)   Resp 16   Ht 5\' 7"  (1.702 m)   Wt 184 lb (83.5 kg)   LMP 05/12/2010   BMI 28.82 kg/m      12/14/2021   10:01 AM 06/08/2021    1:28 PM 12/07/2020    9:08 AM  BP/Weight  Systolic BP 116 104 118  Diastolic BP 78 64 68  Wt. (Lbs) 184 174 173  BMI 28.82 kg/m2 27.25 kg/m2 27.1 kg/m2    Physical Exam Vitals reviewed.  Constitutional:      Appearance: Normal appearance.  HENT:     Right Ear: Tympanic membrane, ear canal and external ear normal.     Left Ear: Tympanic membrane, ear canal and external ear normal.     Nose: Congestion present.     Mouth/Throat:     Pharynx: Oropharynx is clear.  Eyes:     Extraocular Movements: Extraocular movements intact.     Conjunctiva/sclera: Conjunctivae normal.     Pupils: Pupils are equal, round, and reactive to light.  Cardiovascular:     Rate and Rhythm: Normal rate and regular rhythm.     Heart sounds: Normal heart sounds. No murmur heard. Pulmonary:     Effort: Pulmonary effort is normal. No respiratory distress.     Breath sounds: Normal breath sounds.  Abdominal:     Palpations: Abdomen is soft.     Tenderness: There is no abdominal tenderness.  Neurological:     General: No focal deficit present.     Mental Status: She is alert and oriented to person, place, and time. Mental status is at baseline.     Motor: No weakness.     Coordination: Coordination normal.     Gait: Gait normal.  Psychiatric:        Behavior: Behavior normal.      Lab Results  Component Value Date   WBC 4.4 12/14/2021   HGB 12.5 12/14/2021   HCT 37.0 12/14/2021   PLT 258 12/14/2021   GLUCOSE 87 12/14/2021   CHOL 206 (H) 12/07/2020   TRIG 101 12/07/2020   HDL 67 12/07/2020   LDLCALC 121 (H) 12/07/2020   ALT 11 12/14/2021   AST 17 12/14/2021   NA 139 12/14/2021   K 3.9 12/14/2021  CL 102 12/14/2021   CREATININE 0.72 12/14/2021   BUN 11 12/14/2021   CO2 24 12/14/2021   TSH 2.850 12/14/2021   INR 1.02 08/06/2010      Assessment & Plan:   Problem List Items Addressed This Visit       Cardiovascular and Mediastinum   Migraine    Continue propranolol ER. Continue ubrelvy.      Relevant Medications   buPROPion (WELLBUTRIN XL) 150 MG 24 hr tablet     Other   Head contusion - Primary    Concerning for a concussion.      Relevant Orders   CT HEAD WO CONTRAST (5MM) (Completed)   Hypersomnolence    Recommend wellbutrin xl.      Poor concentration    Secondary to concussion.      Altered mental status    Secondary to concussion Rule out bleed with ct brain.      Relevant Orders   B12 and Folate Panel (Completed)   Methylmalonic acid, serum (Completed)   CBC with Differential/Platelet (Completed)   Comprehensive metabolic panel (Completed)   T4, free (Completed)   TSH (Completed)   VITAMIN D 25 Hydroxy (Vit-D Deficiency, Fractures) (Completed)      Body mass index is 28.82 kg/m.   These are the goals we discussed:  Goals   None      This is a list of the screening recommended for you and due dates:  Health Maintenance  Topic Date Due   HIV Screening  Never done   Hepatitis C Screening: USPSTF Recommendation to screen - Ages 22-79 yo.  Never done   Tetanus Vaccine  Never done   Colon Cancer Screening  Never done   Flu Shot  09/21/2021   Zoster (Shingles) Vaccine (1 of 2) Never done   Mammogram  07/15/2023   HPV Vaccine  Aged Out   Pap Smear  Discontinued   COVID-19 Vaccine  Discontinued      Meds ordered this encounter  Medications   buPROPion (WELLBUTRIN XL) 150 MG 24 hr tablet    Sig: Take 1 tablet (150 mg total) by mouth daily for 7 days, THEN 2 tablets (300 mg total) daily for 23 days.    Dispense:  53 tablet    Refill:  0    Follow-up: Return in about 4 weeks (around 01/11/2022) for chronic follow up, cpe fasting in 6-8 weeks.  An After Visit Summary was printed and given to the patient.  Rochel Brome, MD [facility] [phone]

## 2021-12-16 LAB — CBC WITH DIFFERENTIAL/PLATELET
Basophils Absolute: 0 10*3/uL (ref 0.0–0.2)
Basos: 1 %
EOS (ABSOLUTE): 0.1 10*3/uL (ref 0.0–0.4)
Eos: 2 %
Hematocrit: 37 % (ref 34.0–46.6)
Hemoglobin: 12.5 g/dL (ref 11.1–15.9)
Immature Grans (Abs): 0 10*3/uL (ref 0.0–0.1)
Immature Granulocytes: 0 %
Lymphocytes Absolute: 1.7 10*3/uL (ref 0.7–3.1)
Lymphs: 39 %
MCH: 29.3 pg (ref 26.6–33.0)
MCHC: 33.8 g/dL (ref 31.5–35.7)
MCV: 87 fL (ref 79–97)
Monocytes Absolute: 0.4 10*3/uL (ref 0.1–0.9)
Monocytes: 9 %
Neutrophils Absolute: 2.2 10*3/uL (ref 1.4–7.0)
Neutrophils: 49 %
Platelets: 258 10*3/uL (ref 150–450)
RBC: 4.26 x10E6/uL (ref 3.77–5.28)
RDW: 13.2 % (ref 11.7–15.4)
WBC: 4.4 10*3/uL (ref 3.4–10.8)

## 2021-12-16 LAB — METHYLMALONIC ACID, SERUM: Methylmalonic Acid: 143 nmol/L (ref 0–378)

## 2021-12-16 LAB — COMPREHENSIVE METABOLIC PANEL
ALT: 11 IU/L (ref 0–32)
AST: 17 IU/L (ref 0–40)
Albumin/Globulin Ratio: 1.8 (ref 1.2–2.2)
Albumin: 4.6 g/dL (ref 3.9–4.9)
Alkaline Phosphatase: 54 IU/L (ref 44–121)
BUN/Creatinine Ratio: 15 (ref 9–23)
BUN: 11 mg/dL (ref 6–24)
Bilirubin Total: 0.5 mg/dL (ref 0.0–1.2)
CO2: 24 mmol/L (ref 20–29)
Calcium: 9 mg/dL (ref 8.7–10.2)
Chloride: 102 mmol/L (ref 96–106)
Creatinine, Ser: 0.72 mg/dL (ref 0.57–1.00)
Globulin, Total: 2.6 g/dL (ref 1.5–4.5)
Glucose: 87 mg/dL (ref 70–99)
Potassium: 3.9 mmol/L (ref 3.5–5.2)
Sodium: 139 mmol/L (ref 134–144)
Total Protein: 7.2 g/dL (ref 6.0–8.5)
eGFR: 102 mL/min/{1.73_m2} (ref >59)

## 2021-12-16 LAB — TSH: TSH: 2.85 u[IU]/mL (ref 0.450–4.500)

## 2021-12-16 LAB — T4, FREE: Free T4: 1.64 ng/dL (ref 0.82–1.77)

## 2021-12-16 LAB — B12 AND FOLATE PANEL
Folate: 11.8 ng/mL (ref >3.0)
Vitamin B-12: 1553 pg/mL — ABNORMAL HIGH (ref 232–1245)

## 2021-12-16 LAB — VITAMIN D 25 HYDROXY (VIT D DEFICIENCY, FRACTURES): Vit D, 25-Hydroxy: 48.1 ng/mL (ref 30.0–100.0)

## 2021-12-18 DIAGNOSIS — R4182 Altered mental status, unspecified: Secondary | ICD-10-CM | POA: Insufficient documentation

## 2021-12-18 DIAGNOSIS — S0093XA Contusion of unspecified part of head, initial encounter: Secondary | ICD-10-CM | POA: Insufficient documentation

## 2021-12-18 DIAGNOSIS — G43909 Migraine, unspecified, not intractable, without status migrainosus: Secondary | ICD-10-CM | POA: Insufficient documentation

## 2021-12-18 DIAGNOSIS — G4710 Hypersomnia, unspecified: Secondary | ICD-10-CM | POA: Insufficient documentation

## 2021-12-18 DIAGNOSIS — R41840 Attention and concentration deficit: Secondary | ICD-10-CM | POA: Insufficient documentation

## 2021-12-25 ENCOUNTER — Encounter: Payer: Self-pay | Admitting: Family Medicine

## 2021-12-25 MED ORDER — BUPROPION HCL ER (XL) 150 MG PO TB24: ORAL_TABLET | ORAL | 0 refills | Status: DC

## 2021-12-25 NOTE — Assessment & Plan Note (Signed)
Secondary to concussion Rule out bleed with ct brain.

## 2021-12-25 NOTE — Assessment & Plan Note (Signed)
Recommend wellbutrin xl.

## 2021-12-25 NOTE — Assessment & Plan Note (Signed)
Secondary to concussion 

## 2021-12-25 NOTE — Assessment & Plan Note (Signed)
Concerning for a concussion.

## 2021-12-25 NOTE — Assessment & Plan Note (Signed)
Continue propranolol ER. Continue ubrelvy.

## 2021-12-29 ENCOUNTER — Other Ambulatory Visit: Payer: Self-pay

## 2022-01-10 ENCOUNTER — Telehealth: Payer: Self-pay

## 2022-01-10 NOTE — Telephone Encounter (Signed)
Patient called and stated now that she is taking Wellbutrin twice a day she is not able to go to sleep at night, getting very irritable. Patient wanted to know what to do about this, Please advise

## 2022-01-11 NOTE — Telephone Encounter (Signed)
Patient Made Aware, Verbalized Understanding. She has been taking 1 in the AM and 1 and the evening, she is going to start taking 2 in the AM and will follow up at her next appointment.

## 2022-02-01 ENCOUNTER — Telehealth: Payer: Self-pay

## 2022-02-01 ENCOUNTER — Encounter: Payer: Self-pay | Admitting: Family Medicine

## 2022-02-01 ENCOUNTER — Ambulatory Visit: Payer: No Typology Code available for payment source | Admitting: Family Medicine

## 2022-02-01 VITALS — BP 112/76 | HR 72 | Temp 97.5°F | Resp 14 | Ht 67.0 in | Wt 186.8 lb

## 2022-02-01 DIAGNOSIS — F0781 Postconcussional syndrome: Secondary | ICD-10-CM

## 2022-02-01 DIAGNOSIS — Z23 Encounter for immunization: Secondary | ICD-10-CM | POA: Diagnosis not present

## 2022-02-01 DIAGNOSIS — Z1211 Encounter for screening for malignant neoplasm of colon: Secondary | ICD-10-CM | POA: Diagnosis not present

## 2022-02-01 DIAGNOSIS — F331 Major depressive disorder, recurrent, moderate: Secondary | ICD-10-CM

## 2022-02-01 MED ORDER — ESCITALOPRAM OXALATE 5 MG PO TABS: 5.0000 mg | ORAL_TABLET | Freq: Every day | ORAL | 1 refills | Status: DC

## 2022-02-01 NOTE — Progress Notes (Signed)
Subjective:  Patient ID: [name], female    DOB: 1970-11-19  Age: 51 y.o. MRN: [id number]  Chief Complaint  Patient presents with   Hypertension    HPI Head contusion 6-8 weeks ago. Her mental status is still not as good as it was prior to fall. Difficulty with concentration. No dizziness, headaches.   Depression:  stopped wellbutrin XL because of increased irritability. Patient has emotional lability which worsened with wellbutrin xl.      02/01/2022    1:50 PM 12/25/2021    2:38 PM 12/14/2021   10:07 AM  PHQ9 SCORE ONLY  PHQ-9 Total Score 13 17 14        Current Outpatient Medications on File Prior to Visit  Medication Sig Dispense Refill   estradiol (ESTRACE) 1 MG tablet Take 1 tablet (1 mg total) by mouth daily. 90 tablet 3   hydrochlorothiazide (HYDRODIURIL) 25 MG tablet Take 1 tablet (25 mg total) by mouth daily. 90 tablet 1   levothyroxine (SYNTHROID) 125 MCG tablet Take 1 tablet (125 mcg total) by mouth daily before breakfast. 90 tablet 3   Multiple Vitamin (MULTIVITAMIN WITH MINERALS) TABS Take 1 tablet by mouth daily.     ondansetron (ZOFRAN-ODT) 4 MG disintegrating tablet Take 4 mg by mouth every 8 (eight) hours as needed.     propranolol ER (INDERAL LA) 60 MG 24 hr capsule TAKE 1 CAPSULE(60 MG) BY MOUTH DAILY 30 capsule 2   UBRELVY 50 MG TABS One at onset of migraine. Repeat in 2 hours x 1 as needed. 30 tablet 3   buPROPion (WELLBUTRIN XL) 150 MG 24 hr tablet Take 1 tablet (150 mg total) by mouth daily for 7 days, THEN 2 tablets (300 mg total) daily for 23 days. (Patient not taking: Reported on 02/01/2022) 53 tablet 0   EMGALITY 120 MG/ML SOAJ Inject 1 mL into the skin every 30 (thirty) days. (Patient not taking: Reported on 12/14/2021) 3 mL 3   No current facility-administered medications on file prior to visit.   Past Medical History:  Diagnosis Date   Hx of Lyme disease    recently, tx x2 weeks with antibiotic   Hypertension    Hypothyroidism     PONV (postoperative nausea and vomiting)    Past Surgical History:  Procedure Laterality Date   DIAGNOSTIC LAPAROSCOPY     LAPAROSCOPIC ASSISTED VAGINAL HYSTERECTOMY N/A 09/04/2012   Procedure: LAPAROSCOPIC ASSISTED VAGINAL HYSTERECTOMY;  Surgeon: 09/06/2012, MD;  Location: [facility];  Service: Gynecology;  Laterality: N/A;   PARATHYROIDECTOMY     SALPINGOOPHORECTOMY Bilateral 09/04/2012   Procedure: BILATERAL SALPINGO OOPHORECTOMY;  Surgeon: 09/06/2012, MD;  Location: [facility];  Service: Gynecology;  Laterality: Bilateral;    Family History  Problem Relation Age of Onset   Glaucoma Mother    Parkinson's disease Father    Scoliosis Father    Cancer Maternal Grandfather        kidney and brain   Cancer Paternal Grandmother        brain   Social History   Socioeconomic History   Marital status: Married    Spouse name: Alice Burnside   Number of children: Not on file   Years of education: Not on file   Highest education level: Not on file  Occupational History   Occupation: Pet Boarding  Tobacco Use   Smoking status: Former    Types: E-cigarettes   Smokeless tobacco: Never  Substance and Sexual Activity   Alcohol use: Not Currently  Comment: rarely-wine   Drug use: No   Sexual activity: Not on file  Other Topics Concern   Not on file  Social History Narrative   Not on file   Social Determinants of Health   Financial Resource Strain: Low Risk  (12/14/2021)   Overall Financial Resource Strain (CARDIA)    Difficulty of Paying Living Expenses: Not hard at all  Food Insecurity: No Food Insecurity (12/14/2021)   Hunger Vital Sign    Worried About Running Out of Food in the Last Year: Never true    Ran Out of Food in the Last Year: Never true  Transportation Needs: No Transportation Needs (12/14/2021)   PRAPARE - Administrator, Civil Service (Medical): No    Lack of Transportation (Non-Medical): No  Physical Activity: Sufficiently Active (12/14/2021)   Exercise  Vital Sign    Days of Exercise per Week: 7 days    Minutes of Exercise per Session: 60 min  Stress: No Stress Concern Present (12/14/2021)   [employer] of Occupational Health - Occupational Stress Questionnaire    Feeling of Stress : Only a little  Social Connections: Unknown (12/14/2021)   Social Connection and Isolation Panel [NHANES]    Frequency of Communication with Friends and Family: Not on file    Frequency of Social Gatherings with Friends and Family: Not on file    Attends Religious Services: Not on file    Active Member of Clubs or Organizations: Not on file    Attends Banker Meetings: Not on file    Marital Status: Married    Review of Systems  Constitutional:  Positive for fatigue (improved since last visit). Negative for chills and fever.  HENT:  Negative for congestion.   Respiratory:  Negative for cough and shortness of breath.   Cardiovascular:  Negative for chest pain and palpitations.  Gastrointestinal:  Negative for abdominal pain, nausea and vomiting.  Genitourinary:  Negative for dysuria.  Musculoskeletal:  Positive for neck pain. Negative for back pain.  Neurological:  Positive for headaches. Negative for weakness and numbness.  Psychiatric/Behavioral:  Positive for dysphoric mood. The patient is not nervous/anxious.    Objective:  BP 112/76   Pulse 72   Temp (!) 97.5 F (36.4 C)   Resp 14   Ht 5\' 7"  (1.702 m)   Wt 186 lb 12.8 oz (84.7 kg)   LMP 05/12/2010   BMI 29.26 kg/m      02/01/2022    1:45 PM 12/14/2021   10:01 AM 06/08/2021    1:28 PM  BP/Weight  Systolic BP 112 116 104  Diastolic BP 76 78 64  Wt. (Lbs) 186.8 184 174  BMI 29.26 kg/m2 28.82 kg/m2 27.25 kg/m2    Physical Exam Vitals reviewed.  Constitutional:      Appearance: Normal appearance.  Cardiovascular:     Rate and Rhythm: Normal rate and regular rhythm.     Heart sounds: Normal heart sounds.  Pulmonary:     Effort: Pulmonary effort is normal.      Breath sounds: Normal breath sounds.  Neurological:     Mental Status: She is alert and oriented to person, place, and time.  Psychiatric:        Mood and Affect: Mood normal.        Behavior: Behavior normal.     Diabetic Foot Exam - Simple   No data filed      Lab Results  Component Value Date   WBC 4.4  12/14/2021   HGB 12.5 12/14/2021   HCT 37.0 12/14/2021   PLT 258 12/14/2021   GLUCOSE 87 12/14/2021   CHOL 206 (H) 12/07/2020   TRIG 101 12/07/2020   HDL 67 12/07/2020   LDLCALC 121 (H) 12/07/2020   ALT 11 12/14/2021   AST 17 12/14/2021   NA 139 12/14/2021   K 3.9 12/14/2021   CL 102 12/14/2021   CREATININE 0.72 12/14/2021   BUN 11 12/14/2021   CO2 24 12/14/2021   TSH 2.850 12/14/2021   INR 1.02 08/06/2010      Assessment & Plan:   Problem List Items Addressed This Visit       Nervous and Auditory   Postconcussion syndrome    Still having attention issues and emotional lability.          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       Relevant Medications   escitalopram (LEXAPRO) 5 MG tablet   Colon cancer screening   Relevant Orders   Cologuard  .  Meds ordered this encounter  Medications   escitalopram (LEXAPRO) 5 MG tablet    Sig: Take 1 tablet (5 mg total) by mouth daily.    Dispense:  30 tablet    Refill:  1    Orders Placed This Encounter  Procedures   Tdap vaccine greater than or equal to 7yo IM   Cologuard     Follow-up: Return in about 6 weeks (around 03/15/2022) for chronic follow up.  An After Visit Summary was printed and given to the patient.  Blane Ohara, MD [facility] [phone]

## 2022-02-01 NOTE — Patient Instructions (Signed)
Start lexapro 5mg daily

## 2022-02-06 DIAGNOSIS — F0781 Postconcussional syndrome: Secondary | ICD-10-CM | POA: Insufficient documentation

## 2022-02-06 NOTE — Assessment & Plan Note (Signed)
Start lexapro 5mg daily

## 2022-02-06 NOTE — Assessment & Plan Note (Signed)
Still having attention issues and emotional lability.

## 2022-02-09 ENCOUNTER — Other Ambulatory Visit: Payer: Self-pay | Admitting: Family Medicine

## 2022-03-08 ENCOUNTER — Other Ambulatory Visit: Payer: Self-pay | Admitting: Family Medicine

## 2022-03-14 ENCOUNTER — Telehealth: Payer: Self-pay

## 2022-03-14 ENCOUNTER — Ambulatory Visit: Payer: No Typology Code available for payment source | Admitting: Family Medicine

## 2022-03-14 NOTE — Telephone Encounter (Signed)
Patient called and stated she cancel her appointment she had to follow up for her last visit due to Covid and she is trying not to get sick. States the medication is working great and could she do a video visit for her follow up. Please advise.

## 2022-03-15 NOTE — Telephone Encounter (Signed)
Left patient detail message on Voicemail to call office back.

## 2022-04-06 ENCOUNTER — Other Ambulatory Visit: Payer: Self-pay | Admitting: Family Medicine

## 2022-04-06 DIAGNOSIS — I10 Essential (primary) hypertension: Secondary | ICD-10-CM

## 2022-04-28 ENCOUNTER — Encounter: Payer: Self-pay | Admitting: Family Medicine

## 2022-04-30 ENCOUNTER — Other Ambulatory Visit: Payer: Self-pay | Admitting: Family Medicine

## 2022-04-30 MED ORDER — SUMATRIPTAN SUCCINATE 100 MG PO TABS: ORAL_TABLET | ORAL | 2 refills | Status: DC

## 2022-04-30 NOTE — Telephone Encounter (Signed)
I spoke with patient.  Please pull qlipta samples for her.  Dr. Tobie Poet

## 2022-05-13 ENCOUNTER — Other Ambulatory Visit: Payer: Self-pay | Admitting: Family Medicine

## 2022-05-25 ENCOUNTER — Other Ambulatory Visit: Payer: Self-pay | Admitting: Family Medicine

## 2022-05-25 ENCOUNTER — Encounter: Payer: Self-pay | Admitting: Family Medicine

## 2022-05-25 ENCOUNTER — Telehealth: Payer: Self-pay

## 2022-05-25 MED ORDER — QULIPTA 60 MG PO TABS: 1.0000 | ORAL_TABLET | Freq: Every day | ORAL | 1 refills | Status: DC

## 2022-05-25 NOTE — Telephone Encounter (Signed)
Done. Dr. Ulus Hazen  

## 2022-05-25 NOTE — Telephone Encounter (Signed)
Patient called stating that the Qlipta samples that she picked up is working and is requesting a RX be sent to [facility] in [place]

## 2022-05-27 ENCOUNTER — Other Ambulatory Visit: Payer: Self-pay | Admitting: Family Medicine

## 2022-05-31 ENCOUNTER — Telehealth: Payer: Self-pay

## 2022-05-31 NOTE — Telephone Encounter (Signed)
PA submitted and approved via covermymeds for Qulipta. 

## 2022-06-20 ENCOUNTER — Other Ambulatory Visit: Payer: Self-pay | Admitting: Family Medicine

## 2022-06-27 ENCOUNTER — Other Ambulatory Visit: Payer: Self-pay | Admitting: Family Medicine

## 2022-07-07 ENCOUNTER — Other Ambulatory Visit: Payer: Self-pay | Admitting: Family Medicine

## 2022-07-20 ENCOUNTER — Ambulatory Visit: Payer: No Typology Code available for payment source | Admitting: Family Medicine

## 2022-07-20 VITALS — BP 130/80 | HR 65 | Temp 96.3°F | Ht 67.0 in | Wt 183.0 lb

## 2022-07-20 DIAGNOSIS — F3181 Bipolar II disorder: Secondary | ICD-10-CM | POA: Diagnosis not present

## 2022-07-20 DIAGNOSIS — I10 Essential (primary) hypertension: Secondary | ICD-10-CM | POA: Diagnosis not present

## 2022-07-20 DIAGNOSIS — E039 Hypothyroidism, unspecified: Secondary | ICD-10-CM | POA: Diagnosis not present

## 2022-07-20 MED ORDER — ESZOPICLONE 3 MG PO TABS: 3.0000 mg | ORAL_TABLET | Freq: Every day | ORAL | 2 refills | Status: DC

## 2022-07-20 MED ORDER — CARIPRAZINE HCL 1.5 MG PO CAPS: 1.5000 mg | ORAL_CAPSULE | Freq: Every day | ORAL | 0 refills | Status: DC

## 2022-07-20 NOTE — Progress Notes (Signed)
Acute Office Visit  Subjective:    Patient ID: [name], female    DOB: [birth year], 52 y.o.   MRN: [id number]  Chief Complaint  Patient presents with   Anxiety   Depression    HPI: Patient is in today for anxiety and depression. States she is unsure if her current medications could be causing her to feel emotionally disconnected from her spouse, reports she is unable to cry, possibly going separate from her husband. Currently taking lexapro 5 mg daily. Has also tried wellbutrin in the past. IS actively seeking a Careers information officer, marriage and family counselor. 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 Attends church. Expressed she did step outside her marriage in January Trey Paula)- this has been addressed with her spouse. Willing to work on her marriage. States she can be impulsive at times.   Patient has fxhx bipolar disorder.  Past Medical History:  Diagnosis Date   Hx of Lyme disease    recently, tx x2 weeks with antibiotic   Hypertension    Hypothyroidism    PONV (postoperative nausea and vomiting)     Past Surgical History:  Procedure Laterality Date   DIAGNOSTIC LAPAROSCOPY     LAPAROSCOPIC ASSISTED VAGINAL HYSTERECTOMY N/A 09/04/2012   Procedure: LAPAROSCOPIC ASSISTED VAGINAL HYSTERECTOMY;  Surgeon: Miguel Aschoff, MD;  Location: [facility];  Service: Gynecology;  Laterality: N/A;   PARATHYROIDECTOMY     SALPINGOOPHORECTOMY Bilateral 09/04/2012   Procedure: BILATERAL SALPINGO OOPHORECTOMY;  Surgeon: Miguel Aschoff, MD;  Location: [facility];  Service: Gynecology;  Laterality: Bilateral;    Family History  Problem Relation Age of Onset   Glaucoma Mother    Parkinson's disease Father    Scoliosis Father    Cancer Maternal Grandfather        kidney and brain   Cancer Paternal Grandmother        brain    Social History   Socioeconomic History   Marital status: Married    Spouse name: Iverna Fenrich   Number of children: Not on file   Years of education: Not on  file   Highest education level: Not on file  Occupational History   Occupation: Pet Boarding  Tobacco Use   Smoking status: Former    Types: E-cigarettes   Smokeless tobacco: Never  Substance and Sexual Activity   Alcohol use: Not Currently    Comment: rarely-wine   Drug use: No   Sexual activity: Not on file  Other Topics Concern   Not on file  Social History Narrative   Not on file   Social Determinants of Health   Financial Resource Strain: Low Risk  (12/14/2021)   Overall Financial Resource Strain (CARDIA)    Difficulty of Paying Living Expenses: Not hard at all  Food Insecurity: No Food Insecurity (12/14/2021)   Hunger Vital Sign    Worried About Running Out of Food in the Last Year: Never true    Ran Out of Food in the Last Year: Never true  Transportation Needs: No Transportation Needs (12/14/2021)   PRAPARE - Administrator, Civil Service (Medical): No    Lack of Transportation (Non-Medical): No  Physical Activity: Sufficiently Active (12/14/2021)   Exercise Vital Sign    Days of Exercise per Week: 7 days    Minutes of Exercise per Session: 60 min  Stress: No Stress Concern Present (12/14/2021)   [employer] of Occupational Health - Occupational Stress Questionnaire    Feeling of Stress : Only a  little  Social Connections: Unknown (12/14/2021)   Social Connection and Isolation Panel [NHANES]    Frequency of Communication with Friends and Family: Not on file    Frequency of Social Gatherings with Friends and Family: Not on file    Attends Religious Services: Not on file    Active Member of Clubs or Organizations: Not on file    Attends Banker Meetings: Not on file    Marital Status: Married  Intimate Partner Violence: Not At Risk (12/14/2021)   Humiliation, Afraid, Rape, and Kick questionnaire    Fear of Current or Ex-Partner: No    Emotionally Abused: No    Physically Abused: No    Sexually Abused: No    Outpatient  Medications Prior to Visit  Medication Sig Dispense Refill   Atogepant (QULIPTA) 60 MG TABS Take 1 tablet (60 mg total) by mouth daily. 90 tablet 1   EMGALITY 120 MG/ML SOAJ Inject 1 mL into the skin every 30 (thirty) days. (Patient not taking: Reported on 12/14/2021) 3 mL 3   escitalopram (LEXAPRO) 5 MG tablet TAKE 1 TABLET(5 MG) BY MOUTH DAILY 90 tablet 0   estradiol (ESTRACE) 1 MG tablet TAKE 1 TABLET(1 MG) BY MOUTH DAILY 90 tablet 0   hydrochlorothiazide (HYDRODIURIL) 25 MG tablet TAKE 1 TABLET(25 MG) BY MOUTH DAILY 90 tablet 1   levothyroxine (SYNTHROID) 125 MCG tablet TAKE 1 TABLET(125 MCG) BY MOUTH DAILY BEFORE BREAKFAST 90 tablet 0   Multiple Vitamin (MULTIVITAMIN WITH MINERALS) TABS Take 1 tablet by mouth daily.     ondansetron (ZOFRAN-ODT) 4 MG disintegrating tablet Take 4 mg by mouth every 8 (eight) hours as needed.     propranolol ER (INDERAL LA) 60 MG 24 hr capsule TAKE 1 CAPSULE(60 MG) BY MOUTH DAILY 30 capsule 2   SUMAtriptan (IMITREX) 100 MG tablet One at on set of migraine or aura. May repeat in 2 hours x 1 if headache persists or recurs. 9 tablet 2   UBRELVY 50 MG TABS TAKE 1 TABLET BY MOUTH AT ONSET OF MIGRAINE. REPEAT IN 2 HOURS FOR 1 AS NEEDED 30 tablet 3   buPROPion (WELLBUTRIN XL) 150 MG 24 hr tablet Take 1 tablet (150 mg total) by mouth daily for 7 days, THEN 2 tablets (300 mg total) daily for 23 days. (Patient not taking: Reported on 02/01/2022) 53 tablet 0   No facility-administered medications prior to visit.    Allergies  Allergen Reactions   Bacitracin Rash and Hives   Bacitracin-Neomycin-Polymyxin Rash and Hives    Transcribed from previous EMR.   Bacitracin-Polymyxin B Rash and Hives    Transcribed from previous EMR.   Celebrex [Celecoxib]     Irritability    Wellbutrin Xl [Bupropion]     irritability   Neosporin [Neomycin-Bacitracin Zn-Polymyx] Itching and Rash    Review of Systems  Constitutional:  Negative for chills, fatigue and fever.  HENT:   Negative for congestion, ear pain, rhinorrhea and sore throat.   Respiratory:  Negative for cough and shortness of breath.   Cardiovascular:  Negative for chest pain.  Psychiatric/Behavioral:  Negative for dysphoric mood. The patient is not nervous/anxious.        Objective:        07/20/2022    9:41 AM 02/01/2022    1:45 PM 12/14/2021   10:01 AM  Vitals with BMI  Height 5\' 7"  5\' 7"  5\' 7"   Weight 183 lbs 186 lbs 13 oz 184 lbs  BMI 28.66 29.25 28.81  Systolic 130 112 161  Diastolic 80 76 78  Pulse 65 72 78    No data found.   Physical Exam Vitals reviewed.  Constitutional:      Appearance: Normal appearance. She is normal weight.  Neck:     Vascular: No carotid bruit.  Cardiovascular:     Rate and Rhythm: Normal rate and regular rhythm.     Heart sounds: Normal heart sounds.  Pulmonary:     Effort: Pulmonary effort is normal. No respiratory distress.     Breath sounds: Normal breath sounds.  Abdominal:     General: Abdomen is flat. Bowel sounds are normal.     Palpations: Abdomen is soft.     Tenderness: There is no abdominal tenderness.  Neurological:     Mental Status: She is alert and oriented to person, place, and time.  Psychiatric:        Mood and Affect: Mood normal.        Behavior: Behavior normal.     Health Maintenance Due  Topic Date Due   HIV Screening  Never done   Hepatitis C Screening  Never done   Colonoscopy  Never done   Zoster Vaccines- Shingrix (1 of 2) Never done    There are no preventive care reminders to display for this patient.   Lab Results  Component Value Date   TSH 2.850 12/14/2021   Lab Results  Component Value Date   WBC 4.4 12/14/2021   HGB 12.5 12/14/2021   HCT 37.0 12/14/2021   MCV 87 12/14/2021   PLT 258 12/14/2021   Lab Results  Component Value Date   NA 139 12/14/2021   K 3.9 12/14/2021   CO2 24 12/14/2021   GLUCOSE 87 12/14/2021   BUN 11 12/14/2021   CREATININE 0.72 12/14/2021   BILITOT 0.5  12/14/2021   ALKPHOS 54 12/14/2021   AST 17 12/14/2021   ALT 11 12/14/2021   PROT 7.2 12/14/2021   ALBUMIN 4.6 12/14/2021   CALCIUM 9.0 12/14/2021   EGFR 102 12/14/2021   Lab Results  Component Value Date   CHOL 206 (H) 12/07/2020   Lab Results  Component Value Date   HDL 67 12/07/2020   Lab Results  Component Value Date   LDLCALC 121 (H) 12/07/2020   Lab Results  Component Value Date   TRIG 101 12/07/2020   Lab Results  Component Value Date   CHOLHDL 3.1 12/07/2020   No results found for: "HGBA1C"     Assessment & Plan:  There are no diagnoses linked to this encounter.   No orders of the defined types were placed in this encounter.   No orders of the defined types were placed in this encounter.   Total time spent on today's visit was greater than 30 minutes, including both face-to-face time and nonface-to-face time personally spent on review of chart (labs and imaging), discussing labs and goals, discussing further work-up, treatment options, referrals to specialist if needed, reviewing outside records of pertinent, answering patient's questions, and coordinating care.   Follow-up: No follow-ups on file.  An After Visit Summary was printed and given to the patient.  Blane Ohara, MD [facility] [phone]

## 2022-07-20 NOTE — Assessment & Plan Note (Signed)
Discontinue lexapro Start pristiq

## 2022-07-20 NOTE — Patient Instructions (Addendum)
Stop lexapro  Start vraylar 1.5 mg once daily in am.  Start lunesta 3 mg once daily at night.   Counseling options: [facility]. [facility] [place], [facility] [place]. (Given to you by my nurse.)

## 2022-07-21 ENCOUNTER — Other Ambulatory Visit: Payer: Self-pay | Admitting: Family Medicine

## 2022-07-21 DIAGNOSIS — E039 Hypothyroidism, unspecified: Secondary | ICD-10-CM

## 2022-07-21 DIAGNOSIS — F3181 Bipolar II disorder: Secondary | ICD-10-CM | POA: Insufficient documentation

## 2022-07-21 LAB — CBC WITH DIFFERENTIAL/PLATELET
Basophils Absolute: 0 10*3/uL (ref 0.0–0.2)
Basos: 0 %
EOS (ABSOLUTE): 0.1 10*3/uL (ref 0.0–0.4)
Eos: 2 %
Hematocrit: 38.2 % (ref 34.0–46.6)
Hemoglobin: 12.8 g/dL (ref 11.1–15.9)
Immature Grans (Abs): 0 10*3/uL (ref 0.0–0.1)
Immature Granulocytes: 0 %
Lymphocytes Absolute: 1.7 10*3/uL (ref 0.7–3.1)
Lymphs: 36 %
MCH: 29.4 pg (ref 26.6–33.0)
MCHC: 33.5 g/dL (ref 31.5–35.7)
MCV: 88 fL (ref 79–97)
Monocytes Absolute: 0.4 10*3/uL (ref 0.1–0.9)
Monocytes: 9 %
Neutrophils Absolute: 2.5 10*3/uL (ref 1.4–7.0)
Neutrophils: 53 %
Platelets: 254 10*3/uL (ref 150–450)
RBC: 4.36 x10E6/uL (ref 3.77–5.28)
RDW: 12.7 % (ref 11.7–15.4)
WBC: 4.8 10*3/uL (ref 3.4–10.8)

## 2022-07-21 LAB — COMPREHENSIVE METABOLIC PANEL
ALT: 14 IU/L (ref 0–32)
AST: 17 IU/L (ref 0–40)
Albumin/Globulin Ratio: 1.7 (ref 1.2–2.2)
Albumin: 4.5 g/dL (ref 3.9–4.9)
Alkaline Phosphatase: 52 IU/L (ref 44–121)
BUN/Creatinine Ratio: 13 (ref 9–23)
BUN: 9 mg/dL (ref 6–24)
Bilirubin Total: 0.7 mg/dL (ref 0.0–1.2)
CO2: 25 mmol/L (ref 20–29)
Calcium: 9.2 mg/dL (ref 8.7–10.2)
Chloride: 101 mmol/L (ref 96–106)
Creatinine, Ser: 0.69 mg/dL (ref 0.57–1.00)
Globulin, Total: 2.7 g/dL (ref 1.5–4.5)
Glucose: 83 mg/dL (ref 70–99)
Potassium: 3.8 mmol/L (ref 3.5–5.2)
Sodium: 140 mmol/L (ref 134–144)
Total Protein: 7.2 g/dL (ref 6.0–8.5)
eGFR: 106 mL/min/{1.73_m2} (ref >59)

## 2022-07-21 LAB — T4, FREE: Free T4: 2.05 ng/dL — ABNORMAL HIGH (ref 0.82–1.77)

## 2022-07-21 LAB — TSH: TSH: 0.199 u[IU]/mL — ABNORMAL LOW (ref 0.450–4.500)

## 2022-07-21 MED ORDER — LEVOTHYROXINE SODIUM 112 MCG PO TABS: 112.0000 ug | ORAL_TABLET | Freq: Every day | ORAL | 0 refills | Status: DC

## 2022-07-21 NOTE — Assessment & Plan Note (Signed)
Previously well controlled Continue Synthroid at current dose  Recheck TSH and adjust Synthroid as indicated   

## 2022-07-21 NOTE — Assessment & Plan Note (Signed)
Well controlled. No changes to medicines.  Continue to work on eating a healthy diet and exercise.    

## 2022-07-21 NOTE — Assessment & Plan Note (Signed)
Stop lexapro  Start vraylar 1.5 mg once daily in am.  Start lunesta 3 mg once daily at night.

## 2022-07-22 ENCOUNTER — Encounter: Payer: Self-pay | Admitting: Family Medicine

## 2022-07-25 ENCOUNTER — Telehealth: Payer: Self-pay

## 2022-07-25 ENCOUNTER — Encounter: Payer: Self-pay | Admitting: Family Medicine

## 2022-07-25 ENCOUNTER — Other Ambulatory Visit: Payer: Self-pay | Admitting: Family Medicine

## 2022-07-25 MED ORDER — LAMOTRIGINE 25 MG PO TABS: ORAL_TABLET | ORAL | 0 refills | Status: DC

## 2022-07-25 NOTE — Telephone Encounter (Signed)
Patient called stating that since she started the vraylar all she had noticed was that she was shaking and her eyes were shaking and she felt as if she could not concentrate and could not really tell if the medication did help her moods or not because she felt as if she did not take it long enough. Please advise.

## 2022-07-25 NOTE — Telephone Encounter (Signed)
Recommend stop vraylar.  Send lamictal 25 mg once daily at night x 2 weeks, then increase to 2 daily at night.   Patient should keep appt at the end of the month.  Dr. Sedalia Muta

## 2022-07-25 NOTE — Telephone Encounter (Signed)
Patient notified

## 2022-07-26 ENCOUNTER — Telehealth: Payer: Self-pay

## 2022-07-26 ENCOUNTER — Other Ambulatory Visit: Payer: Self-pay | Admitting: Family Medicine

## 2022-07-26 DIAGNOSIS — F3181 Bipolar II disorder: Secondary | ICD-10-CM

## 2022-07-26 NOTE — Telephone Encounter (Signed)
Patient sent message via Mychart and was requesting a referral to be sent. I did not see an order in for this referral but patient states that this place requires a referral. Can you put this referral order in?  [name]  to [facility] (supporting Blane Ohara, MD)      07/25/22  8:43 PM I'd like to get a referral to go see [name] at [facility], to work with me on therapy and all directly related bipolar disorder. [name] works under Dr Betti Cruz. Their phone number is [phone].  My husband and I have also started counseling with [name] that was recommended to me at my visit.

## 2022-07-29 ENCOUNTER — Other Ambulatory Visit: Payer: Self-pay | Admitting: Family Medicine

## 2022-07-29 ENCOUNTER — Other Ambulatory Visit: Payer: Self-pay | Admitting: Physician Assistant

## 2022-08-05 ENCOUNTER — Encounter: Payer: Self-pay | Admitting: Family Medicine

## 2022-08-08 ENCOUNTER — Other Ambulatory Visit: Payer: Self-pay | Admitting: Family Medicine

## 2022-08-08 DIAGNOSIS — G43E09 Chronic migraine with aura, not intractable, without status migrainosus: Secondary | ICD-10-CM

## 2022-08-08 DIAGNOSIS — F3181 Bipolar II disorder: Secondary | ICD-10-CM

## 2022-08-17 ENCOUNTER — Ambulatory Visit: Payer: No Typology Code available for payment source | Admitting: Family Medicine

## 2022-08-30 ENCOUNTER — Telehealth: Payer: Self-pay

## 2022-08-30 NOTE — Telephone Encounter (Signed)
Amber seen a note in her chart under media indicating that this patient may have transferred. I left a her a VM if she calls back. just need to know if she transferred to [facility].....she was seen by a Dr. Simone Curia who did a referral for her.

## 2022-09-13 ENCOUNTER — Other Ambulatory Visit: Payer: Self-pay | Admitting: Internal Medicine

## 2022-09-13 DIAGNOSIS — Z1231 Encounter for screening mammogram for malignant neoplasm of breast: Secondary | ICD-10-CM

## 2022-09-28 ENCOUNTER — Ambulatory Visit
Admission: RE | Admit: 2022-09-28 | Discharge: 2022-09-28 | Disposition: A | Discharge status: Home / Self Care | Payer: No Typology Code available for payment source | Source: Ambulatory Visit | Attending: Internal Medicine | Admitting: Internal Medicine

## 2022-09-28 DIAGNOSIS — Z1231 Encounter for screening mammogram for malignant neoplasm of breast: Secondary | ICD-10-CM

## 2022-10-20 ENCOUNTER — Other Ambulatory Visit: Payer: Self-pay | Admitting: Family Medicine

## 2022-11-03 ENCOUNTER — Ambulatory Visit (INDEPENDENT_AMBULATORY_CARE_PROVIDER_SITE_OTHER): Payer: No Typology Code available for payment source | Admitting: Neurology

## 2022-11-03 ENCOUNTER — Other Ambulatory Visit: Payer: Self-pay | Admitting: Neurology

## 2022-11-03 ENCOUNTER — Encounter: Payer: Self-pay | Admitting: Neurology

## 2022-11-03 VITALS — BP 116/72 | Ht 67.0 in | Wt 183.0 lb

## 2022-11-03 DIAGNOSIS — F0781 Postconcussional syndrome: Secondary | ICD-10-CM

## 2022-11-03 DIAGNOSIS — R454 Irritability and anger: Secondary | ICD-10-CM | POA: Diagnosis not present

## 2022-11-03 DIAGNOSIS — R4189 Other symptoms and signs involving cognitive functions and awareness: Secondary | ICD-10-CM | POA: Diagnosis not present

## 2022-11-03 DIAGNOSIS — R410 Disorientation, unspecified: Secondary | ICD-10-CM

## 2022-11-03 DIAGNOSIS — G43709 Chronic migraine without aura, not intractable, without status migrainosus: Secondary | ICD-10-CM | POA: Diagnosis not present

## 2022-11-03 MED ORDER — AMANTADINE HCL 100 MG PO CAPS: 100.0000 mg | ORAL_CAPSULE | Freq: Two times a day (BID) | ORAL | 0 refills | Status: DC

## 2022-11-03 NOTE — Progress Notes (Signed)
[facility]  PATIENT: [name] DOB: 09-Apr-1970  REQUESTING CLINICIAN: Simone Curia, MD HISTORY FROM: Patient  REASON FOR VISIT: Post concussive syndrome    HISTORICAL  CHIEF COMPLAINT:  Chief Complaint  Patient presents with   Follow-up   New Patient (Initial Visit)    Rm 13, NP, post concussion symtoms, fall in 11/2021, headaches, issues with reasoning, focusing, depression, irritabltiy    HISTORY OF PRESENT ILLNESS:  This is a 52 year old woman past medical history of hypothyroidism, hypertension, chronic migraine who is presenting after fall last October.  Patient reports last October, she was going down the stairs, missed a step and fell and hit her head on the wall.  She did not lose consciousness.  Since then she has been having multiple symptoms including oversleeping, sleeping a lot, taking naps during the day.  She also have difficulty with multitasking, difficulty with concentration, with focusing. She reports her reasoning is off, she is unmotivated and sometimes she will make irrational decisions.  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  Since the fall and the concussion she has been having difficulty completing her job.  She also have issues with her husband leading to separation but now they are back together.  She does confirm that her symptom has improved since October but she is not back to her baseline and she is still symptomatic.  She did have head CT back in October and there were no acute findings. She has seen her PCP, put on Lexapro 5 mg felt like that helped a little bit, she was also put on lamotrigine Lamictal but she did not like the side effect and self discontinued the medication.  In terms of her migraines, the frequency is manageable, will get about maybe 1 headache per week or every 2 weeks and Ubrelvy does help.  She is on [place] for prevention.    OTHER MEDICAL CONDITIONS: Chronic migraines, Hypothyroidism,  Hypertension    REVIEW OF SYSTEMS: Full 14 system review of systems performed and negative with exception of: As noted in the HPI   ALLERGIES: Allergies  Allergen Reactions   Bacitracin Rash and Hives   Bacitracin-Neomycin-Polymyxin Rash and Hives    Transcribed from previous EMR.   Bacitracin-Polymyxin B Rash and Hives    Transcribed from previous EMR.   Celebrex [Celecoxib]     Irritability    Vraylar [Cariprazine]     agitation   Wellbutrin Xl [Bupropion]     irritability   Neosporin [Neomycin-Bacitracin Zn-Polymyx] Itching and Rash    HOME MEDICATIONS: Outpatient Medications Prior to Visit  Medication Sig Dispense Refill   Atogepant (QULIPTA) 60 MG TABS Take 1 tablet (60 mg total) by mouth daily. 90 tablet 1   escitalopram (LEXAPRO) 5 MG tablet TAKE 1 TABLET(5 MG) BY MOUTH DAILY 90 tablet 0   estradiol (ESTRACE) 1 MG tablet TAKE 1 TABLET(1 MG) BY MOUTH DAILY 90 tablet 0   hydrochlorothiazide (HYDRODIURIL) 25 MG tablet TAKE 1 TABLET(25 MG) BY MOUTH DAILY 90 tablet 1   levothyroxine (SYNTHROID) 112 MCG tablet TAKE 1 TABLET(112 MCG) BY MOUTH DAILY BEFORE BREAKFAST 30 tablet 0   Multiple Vitamin (MULTIVITAMIN WITH MINERALS) TABS Take 1 tablet by mouth daily.     propranolol ER (INDERAL LA) 60 MG 24 hr capsule TAKE 1 CAPSULE(60 MG) BY MOUTH DAILY 90 capsule 0   UBRELVY 50 MG TABS TAKE 1 TABLET BY MOUTH AT ONSET OF MIGRAINE. REPEAT IN 2 HOURS FOR 1 AS  NEEDED 30 tablet 3   EMGALITY 120 MG/ML SOAJ Inject 1 mL into the skin every 30 (thirty) days. (Patient not taking: Reported on 12/14/2021) 3 mL 3   eszopiclone 3 MG TABS Take 1 tablet (3 mg total) by mouth at bedtime. Take immediately before bedtime (Patient not taking: Reported on 11/03/2022) 30 tablet 2   lamoTRIgine (LAMICTAL) 25 MG tablet Take 1 tablet (25 mg total) by mouth at bedtime for 14 days, THEN 2 tablets (50 mg total) at bedtime for 16 days. 46 tablet 0   ondansetron (ZOFRAN-ODT) 4 MG disintegrating tablet Take 4 mg by  mouth every 8 (eight) hours as needed. (Patient not taking: Reported on 11/03/2022)     SUMAtriptan (IMITREX) 100 MG tablet One at on set of migraine or aura. May repeat in 2 hours x 1 if headache persists or recurs. (Patient not taking: Reported on 11/03/2022) 9 tablet 2   No facility-administered medications prior to visit.    PAST MEDICAL HISTORY: Past Medical History:  Diagnosis Date   Concussion    Hx of Lyme disease    recently, tx x2 weeks with antibiotic   Hypertension    Hypothyroidism    PONV (postoperative nausea and vomiting)     PAST SURGICAL HISTORY: Past Surgical History:  Procedure Laterality Date   DIAGNOSTIC LAPAROSCOPY     LAPAROSCOPIC ASSISTED VAGINAL HYSTERECTOMY N/A 09/04/2012   Procedure: LAPAROSCOPIC ASSISTED VAGINAL HYSTERECTOMY;  Surgeon: Miguel Aschoff, MD;  Location: [facility];  Service: Gynecology;  Laterality: N/A;   PARATHYROIDECTOMY     SALPINGOOPHORECTOMY Bilateral 09/04/2012   Procedure: BILATERAL SALPINGO OOPHORECTOMY;  Surgeon: Miguel Aschoff, MD;  Location: [facility];  Service: Gynecology;  Laterality: Bilateral;    FAMILY HISTORY: Family History  Problem Relation Age of Onset   Glaucoma Mother    Parkinson's disease Father    Scoliosis Father    Cancer Maternal Grandfather        kidney and brain   Cancer Paternal Grandmother        brain    SOCIAL HISTORY: Social History   Socioeconomic History   Marital status: Married    Spouse name: Mechell Southard   Number of children: Not on file   Years of education: Not on file   Highest education level: Not on file  Occupational History   Occupation: Pet Boarding  Tobacco Use   Smoking status: Former    Types: E-cigarettes   Smokeless tobacco: Never  Vaping Use   Vaping status: Not on file  Substance and Sexual Activity   Alcohol use: Not Currently    Comment: rarely-wine   Drug use: No   Sexual activity: Not on file  Other Topics Concern   Not on file  Social History Narrative   Right handed    Caffeine-3-4   Social Determinants of Health   Financial Resource Strain: Low Risk  (12/14/2021)   Overall Financial Resource Strain (CARDIA)    Difficulty of Paying Living Expenses: Not hard at all  Food Insecurity: No Food Insecurity (12/14/2021)   Hunger Vital Sign    Worried About Running Out of Food in the Last Year: Never true    Ran Out of Food in the Last Year: Never true  Transportation Needs: No Transportation Needs (12/14/2021)   PRAPARE - Administrator, Civil Service (Medical): No    Lack of Transportation (Non-Medical): No  Physical Activity: Sufficiently Active (12/14/2021)   Exercise Vital Sign    Days of Exercise per  Week: 7 days    Minutes of Exercise per Session: 60 min  Stress: No Stress Concern Present (12/14/2021)   [employer] of Occupational Health - Occupational Stress Questionnaire    Feeling of Stress : Only a little  Social Connections: Unknown (12/14/2021)   Social Connection and Isolation Panel [NHANES]    Frequency of Communication with Friends and Family: Not on file    Frequency of Social Gatherings with Friends and Family: Not on file    Attends Religious Services: Not on file    Active Member of Clubs or Organizations: Not on file    Attends Banker Meetings: Not on file    Marital Status: Married  Intimate Partner Violence: Not At Risk (12/14/2021)   Humiliation, Afraid, Rape, and Kick questionnaire    Fear of Current or Ex-Partner: No    Emotionally Abused: No    Physically Abused: No    Sexually Abused: No    PHYSICAL EXAM  GENERAL EXAM/CONSTITUTIONAL: Vitals:  Vitals:   11/03/22 0923  BP: 116/72  Weight: 183 lb (83 kg)  Height: 5\' 7"  (1.702 m)   Body mass index is 28.66 kg/m. Wt Readings from Last 3 Encounters:  11/03/22 183 lb (83 kg)  07/20/22 183 lb (83 kg)  02/01/22 186 lb 12.8 oz (84.7 kg)   Patient is in no distress; well developed, nourished and groomed; neck is  supple  MUSCULOSKELETAL: Gait, strength, tone, movements noted in Neurologic exam below  NEUROLOGIC: MENTAL STATUS:      No data to display            11/03/2022    9:57 AM  Montreal Cognitive Assessment   Visuospatial/ Executive (0/5) 4  Naming (0/3) 2  Attention: Read list of digits (0/2) 1  Attention: Read list of letters (0/1) 0  Attention: Serial 7 subtraction starting at 100 (0/3) 3  Language: Repeat phrase (0/2) 2  Language : Fluency (0/1) 0  Abstraction (0/2) 2  Delayed Recall (0/5) 3  Orientation (0/6) 6  Total 23   awake, alert, oriented to person, place and time recent and remote memory intact normal attention and concentration language fluent, comprehension intact, naming intact fund of knowledge appropriate  CRANIAL NERVE:  2nd, 3rd, 4th, 6th - Visual fields full to confrontation, extraocular muscles intact, no nystagmus 5th - facial sensation symmetric 7th - facial strength symmetric 8th - hearing intact 9th - palate elevates symmetrically, uvula midline 11th - shoulder shrug symmetric 12th - tongue protrusion midline  MOTOR:  normal bulk and tone, full strength in the BUE, BLE  SENSORY:  normal and symmetric to light touch  COORDINATION:  finger-nose-finger, fine finger movements normal  REFLEXES:  deep tendon reflexes present and symmetric  GAIT/STATION:  normal   DIAGNOSTIC DATA (LABS, IMAGING, TESTING) - I reviewed patient records, labs, notes, testing and imaging myself where available.  Lab Results  Component Value Date   WBC 4.8 07/20/2022   HGB 12.8 07/20/2022   HCT 38.2 07/20/2022   MCV 88 07/20/2022   PLT 254 07/20/2022      Component Value Date/Time   NA 140 07/20/2022 1042   K 3.8 07/20/2022 1042   CL 101 07/20/2022 1042   CO2 25 07/20/2022 1042   GLUCOSE 83 07/20/2022 1042   GLUCOSE 78 08/29/2012 1219   BUN 9 07/20/2022 1042   CREATININE 0.69 07/20/2022 1042   CALCIUM 9.2 07/20/2022 1042   CALCIUM NOT  PERFORMED 08/06/2010 1002   PROT 7.2 07/20/2022  1042   ALBUMIN 4.5 07/20/2022 1042   AST 17 07/20/2022 1042   ALT 14 07/20/2022 1042   ALKPHOS 52 07/20/2022 1042   BILITOT 0.7 07/20/2022 1042   GFRNONAA >90 08/29/2012 1219   GFRAA >90 08/29/2012 1219   Lab Results  Component Value Date   CHOL 206 (H) 12/07/2020   HDL 67 12/07/2020   LDLCALC 121 (H) 12/07/2020   TRIG 101 12/07/2020   CHOLHDL 3.1 12/07/2020   No results found for: "HGBA1C" Lab Results  Component Value Date   VITAMINB12 1,553 (H) 12/14/2021   Lab Results  Component Value Date   TSH 0.199 (L) 07/20/2022    CT Head 12/14/2021 No acute intracranial abnormality.     ASSESSMENT AND PLAN  52 y.o. year old female with history of chronic migraines, hypertension, hypothyroidism who is presenting after a fall and concussion back in October.  Since then, she has postconcussive syndrome including irritability, headaches, brain fog, difficulty with concentration and focus.  Even though her symptoms are mildly improving, she is not back to her normal self.  Her head CT back in October did not show any acute abnormality.  Plan for patient is to start amantadine for total of 90 days.  I also encouraged her to start exercising, at least 5 days a week, she voiced understanding.  Today, on exam her MoCA was 23 showing some deficit in attention and recall and I ask her to repeat the test in a year, her PCP can administer the test.  Continue to follow with PCP and return as needed or any other concern including worsening migraines.   1. Chronic migraine w/o aura, not intractable, w/o stat migr   2. Post concussive syndrome   3. Brain fog   4. Irritability   5. Confusion      Patient Instructions  Continue current medications Start amantadine 100 mg daily for the next 90 days Increase physical exercise, at least 5 days a week Continue to follow with PCP and return as needed.  No orders of the defined types were placed in  this encounter.   Meds ordered this encounter  Medications   amantadine (SYMMETREL) 100 MG capsule    Sig: Take 1 capsule (100 mg total) by mouth 2 (two) times daily.    Dispense:  90 capsule    Refill:  0    Return if symptoms worsen or fail to improve.  I have spent a total of 65 minutes dedicated to this patient today, preparing to see patient, performing a medically appropriate examination and evaluation, ordering tests and/or medications and procedures, and counseling and educating the patient/family/caregiver; independently interpreting result and communicating results to the family/patient/caregiver; and documenting clinical information in the electronic medical record.   Windell Norfolk, MD 11/03/2022, 10:17 AM  [facility] [address] [place], [place] [address] [phone]

## 2022-11-03 NOTE — Patient Instructions (Signed)
Continue current medications Start amantadine 100 mg daily for the next 90 days Increase physical exercise, at least 5 days a week Continue to follow with PCP and return as needed.

## 2022-12-10 ENCOUNTER — Other Ambulatory Visit: Payer: Self-pay | Admitting: Family Medicine

## 2022-12-10 ENCOUNTER — Other Ambulatory Visit: Payer: Self-pay | Admitting: Physician Assistant

## 2023-02-03 ENCOUNTER — Other Ambulatory Visit: Payer: Self-pay | Admitting: Family Medicine

## 2023-03-03 ENCOUNTER — Telehealth: Payer: Self-pay | Admitting: Neurology

## 2023-03-03 NOTE — Telephone Encounter (Signed)
 Pt is asking for a call to discuss her continued migraines.  Pt would like a call to discuss starting Botox.

## 2023-03-06 NOTE — Telephone Encounter (Signed)
 Lmtrc 1st attempt

## 2023-03-07 NOTE — Telephone Encounter (Signed)
 Please advise patient to increase Topiramate to 100 mg twice daily. Add her to the cancellation list to discuss Botox treatment.

## 2023-03-07 NOTE — Telephone Encounter (Signed)
 Called pt and she stated that medication she is on now isn't keeping migraines at bay. Interested in starting botox. Migraines occurred at minimal 1 weekly for the past 4 weeks. Pt stated that she started topirimate 50 mg bid and that she stopped imitrex  due to body stiffness and body tensing up. Insurance won't cover aimovig/emgality /qulipta /ubrelvy .  Migraine intensity 8/10. Triggers include stress, weather, smells, food, lights. Says Dr. Jama pcp recommended botox to be completed by gna. I told pt that I would send to Dr. Gregg and get his recommendation.

## 2023-03-13 NOTE — Telephone Encounter (Signed)
I have not started any type of Botox auth for this pt, I only see below message where Cinda Quest had discussed with her. Looks like Dr. Teresa Coombs would like for her to come in for an OV first, we would likely need updated notes for insurance regardless.

## 2023-03-13 NOTE — Telephone Encounter (Signed)
Patient had called in to cancel her July appointment because she was under the impression that she was getting started on botox. I was able to move up her July appointment and advised her that Dr Teresa Coombs wanted to bring her in to discuss possible botox.   Please advise if anything has been started for this patient regarding botox [facility], she stated she spoke with someone and answered questions related to it

## 2023-04-26 ENCOUNTER — Telehealth: Payer: Self-pay | Admitting: Anesthesiology

## 2023-04-26 ENCOUNTER — Encounter: Payer: Self-pay | Admitting: Neurology

## 2023-04-26 ENCOUNTER — Ambulatory Visit (INDEPENDENT_AMBULATORY_CARE_PROVIDER_SITE_OTHER): Payer: Self-pay | Admitting: Neurology

## 2023-04-26 VITALS — BP 121/68 | HR 62 | Ht 67.0 in | Wt 174.5 lb

## 2023-04-26 DIAGNOSIS — R4189 Other symptoms and signs involving cognitive functions and awareness: Secondary | ICD-10-CM

## 2023-04-26 DIAGNOSIS — F0781 Postconcussional syndrome: Secondary | ICD-10-CM | POA: Diagnosis not present

## 2023-04-26 DIAGNOSIS — G43709 Chronic migraine without aura, not intractable, without status migrainosus: Secondary | ICD-10-CM

## 2023-04-26 MED ORDER — TOPIRAMATE 50 MG PO TABS: 50.0000 mg | ORAL_TABLET | Freq: Two times a day (BID) | ORAL | 3 refills | Status: DC

## 2023-04-26 MED ORDER — SUMATRIPTAN SUCCINATE 100 MG PO TABS: ORAL_TABLET | ORAL | 2 refills | Status: DC

## 2023-04-26 MED ORDER — AMANTADINE HCL 100 MG PO CAPS: 100.0000 mg | ORAL_CAPSULE | Freq: Every day | ORAL | 1 refills | Status: DC

## 2023-04-26 NOTE — Telephone Encounter (Deleted)
 Marland Kitchen

## 2023-04-26 NOTE — Telephone Encounter (Signed)
 [name]

## 2023-04-26 NOTE — Telephone Encounter (Signed)
 Elon Jester @ check-in LVM for pt requesting insurance info, currently none on file.

## 2023-04-26 NOTE — Patient Instructions (Signed)
 Continue with amantadine daily, consider decreasing to once every other day if symptoms continue to improve Continue Topamax 50 mg twice daily Continue your other medications Will request Botox therapy Sumatriptan as needed for abortive medication Return in 32-months or sooner if worse

## 2023-04-26 NOTE — Progress Notes (Signed)
 [facility]  PATIENT: [name] DOB: 07-20-1970  REQUESTING CLINICIAN: Simone Curia, MD HISTORY FROM: Patient  REASON FOR VISIT: Post concussive syndrome    HISTORICAL  CHIEF COMPLAINT:  Chief Complaint  Patient presents with   Room 12    Pt is here Alone. Pt states that she would like to know how long she has to stay on the Amantadine. Pt states that her migraines are controlled.    INTERVAL HISTORY 04/26/2023:  Patient presents today for follow-up, last visit was in September, since then she tells me that she doing better in terms of the TBI.  Stated that amantadine has been helpful. In ter, of the migraines, she is still struggling with chronic migraine.  She tells me that she has tried in the past Qulipta, Emgality, Propranolol, Bernita Raisin without control of her migraine.  She does have sumatriptan as abortive.  Currently she is on Topamax as migraine prevention and she is interested in Botox therapy.   HISTORY OF PRESENT ILLNESS:  This is a 53 year old woman past medical history of hypothyroidism, hypertension, chronic migraine who is presenting after fall last October.  Patient reports last October, she was going down the stairs, missed a step and fell and hit her head on the wall.  She did not lose consciousness.  Since then she has been having multiple symptoms including oversleeping, sleeping a lot, taking naps during the day.  She also have difficulty with multitasking, difficulty with concentration, with focusing. She reports her reasoning is off, she is unmotivated and sometimes she will make irrational decisions.  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  Since the fall and the concussion she has been having difficulty completing her job.  She also have issues with her husband leading to separation but now they are back together.  She does confirm that her symptom has improved since October but she is not back to her baseline and she is  still symptomatic.  She did have head CT back in October and there were no acute findings. She has seen her PCP, put on Lexapro 5 mg felt like that helped a little bit, she was also put on lamotrigine Lamictal but she did not like the side effect and self discontinued the medication.  In terms of her migraines, the frequency is manageable, will get about maybe 1 headache per week or every 2 weeks and Ubrelvy does help.  She is on [place] for prevention.    OTHER MEDICAL CONDITIONS: Chronic migraines, Hypothyroidism, Hypertension    REVIEW OF SYSTEMS: Full 14 system review of systems performed and negative with exception of: As noted in the HPI   ALLERGIES: Allergies  Allergen Reactions   Bacitracin Rash and Hives   Bacitracin-Neomycin-Polymyxin Rash and Hives    Transcribed from previous EMR.   Bacitracin-Polymyxin B Rash and Hives    Transcribed from previous EMR.   Celebrex [Celecoxib]     Irritability    Vraylar [Cariprazine]     agitation   Wellbutrin Xl [Bupropion]     irritability   Neosporin [Neomycin-Bacitracin Zn-Polymyx] Itching and Rash    HOME MEDICATIONS: Outpatient Medications Prior to Visit  Medication Sig Dispense Refill   cetirizine (ZYRTEC) 10 MG chewable tablet Chew 10 mg by mouth daily.     levothyroxine (SYNTHROID) 112 MCG tablet TAKE 1 TABLET(112 MCG) BY MOUTH DAILY BEFORE BREAKFAST 30 tablet 0   Multiple Vitamin (MULTIVITAMIN WITH MINERALS) TABS Take 1 tablet by mouth daily.  propranolol ER (INDERAL LA) 60 MG 24 hr capsule TAKE 1 CAPSULE(60 MG) BY MOUTH DAILY 90 capsule 0   amantadine (SYMMETREL) 100 MG capsule TAKE 1 CAPSULE(100 MG) BY MOUTH TWICE DAILY 180 capsule 1   escitalopram (LEXAPRO) 5 MG tablet TAKE 1 TABLET(5 MG) BY MOUTH DAILY (Patient taking differently: 10 mg.) 90 tablet 0   topiramate (TOPAMAX) 50 MG tablet Take 50 mg by mouth 2 (two) times daily.     EMGALITY 120 MG/ML SOAJ Inject 1 mL into the skin every 30 (thirty) days. (Patient not  taking: Reported on 04/26/2023) 3 mL 3   estradiol (ESTRACE) 1 MG tablet TAKE 1 TABLET(1 MG) BY MOUTH DAILY (Patient not taking: Reported on 04/26/2023) 90 tablet 0   eszopiclone 3 MG TABS Take 1 tablet (3 mg total) by mouth at bedtime. Take immediately before bedtime (Patient not taking: Reported on 04/26/2023) 30 tablet 2   hydrochlorothiazide (HYDRODIURIL) 25 MG tablet TAKE 1 TABLET(25 MG) BY MOUTH DAILY (Patient not taking: Reported on 04/26/2023) 90 tablet 1   lamoTRIgine (LAMICTAL) 25 MG tablet Take 1 tablet (25 mg total) by mouth at bedtime for 14 days, THEN 2 tablets (50 mg total) at bedtime for 16 days. 46 tablet 0   ondansetron (ZOFRAN-ODT) 4 MG disintegrating tablet Take 4 mg by mouth every 8 (eight) hours as needed. (Patient not taking: Reported on 04/26/2023)     QULIPTA 60 MG TABS TAKE 1 TABLET(60 MG) BY MOUTH DAILY (Patient not taking: Reported on 04/26/2023) 90 tablet 1   SUMAtriptan (IMITREX) 100 MG tablet One at on set of migraine or aura. May repeat in 2 hours x 1 if headache persists or recurs. (Patient not taking: Reported on 04/26/2023) 9 tablet 2   UBRELVY 50 MG TABS TAKE 1 TABLET BY MOUTH AT ONSET OF MIGRAINE. REPEAT IN 2 HOURS FOR 1 AS NEEDED (Patient not taking: Reported on 04/26/2023) 30 tablet 3   No facility-administered medications prior to visit.    PAST MEDICAL HISTORY: Past Medical History:  Diagnosis Date   Concussion    Hx of Lyme disease    recently, tx x2 weeks with antibiotic   Hypertension    Hypothyroidism    PONV (postoperative nausea and vomiting)     PAST SURGICAL HISTORY: Past Surgical History:  Procedure Laterality Date   DIAGNOSTIC LAPAROSCOPY     LAPAROSCOPIC ASSISTED VAGINAL HYSTERECTOMY N/A 09/04/2012   Procedure: LAPAROSCOPIC ASSISTED VAGINAL HYSTERECTOMY;  Surgeon: Miguel Aschoff, MD;  Location: [facility];  Service: Gynecology;  Laterality: N/A;   PARATHYROIDECTOMY     SALPINGOOPHORECTOMY Bilateral 09/04/2012   Procedure: BILATERAL SALPINGO OOPHORECTOMY;   Surgeon: Miguel Aschoff, MD;  Location: [facility];  Service: Gynecology;  Laterality: Bilateral;    FAMILY HISTORY: Family History  Problem Relation Age of Onset   Glaucoma Mother    Parkinson's disease Father    Scoliosis Father    Cancer Maternal Grandfather        kidney and brain   Cancer Paternal Grandmother        brain    SOCIAL HISTORY: Social History   Socioeconomic History   Marital status: Married    Spouse name: Felesia Stahlecker   Number of children: Not on file   Years of education: Not on file   Highest education level: Not on file  Occupational History   Occupation: Pet Boarding  Tobacco Use   Smoking status: Former    Types: E-cigarettes   Smokeless tobacco: Never  Advertising account planner  Vaping status: Not on file  Substance and Sexual Activity   Alcohol use: Not Currently    Comment: rarely-wine   Drug use: No   Sexual activity: Not on file  Other Topics Concern   Not on file  Social History Narrative   Right handed   Caffeine-3-4   Social Drivers of Health   Financial Resource Strain: Low Risk  (12/14/2021)   Overall Financial Resource Strain (CARDIA)    Difficulty of Paying Living Expenses: Not hard at all  Food Insecurity: No Food Insecurity (12/14/2021)   Hunger Vital Sign    Worried About Running Out of Food in the Last Year: Never true    Ran Out of Food in the Last Year: Never true  Transportation Needs: No Transportation Needs (12/14/2021)   PRAPARE - Administrator, Civil Service (Medical): No    Lack of Transportation (Non-Medical): No  Physical Activity: Sufficiently Active (12/14/2021)   Exercise Vital Sign    Days of Exercise per Week: 7 days    Minutes of Exercise per Session: 60 min  Stress: No Stress Concern Present (12/14/2021)   [employer] of Occupational Health - Occupational Stress Questionnaire    Feeling of Stress : Only a little  Social Connections: Unknown (12/14/2021)   Social Connection and Isolation Panel [NHANES]     Frequency of Communication with Friends and Family: Not on file    Frequency of Social Gatherings with Friends and Family: Not on file    Attends Religious Services: Not on file    Active Member of Clubs or Organizations: Not on file    Attends Banker Meetings: Not on file    Marital Status: Married  Intimate Partner Violence: Not At Risk (12/14/2021)   Humiliation, Afraid, Rape, and Kick questionnaire    Fear of Current or Ex-Partner: No    Emotionally Abused: No    Physically Abused: No    Sexually Abused: No    PHYSICAL EXAM  GENERAL EXAM/CONSTITUTIONAL: Vitals:  Vitals:   04/26/23 1210  BP: 121/68  Pulse: 62  Weight: 174 lb 8 oz (79.2 kg)  Height: 5\' 7"  (1.702 m)    Body mass index is 27.33 kg/m. Wt Readings from Last 3 Encounters:  04/26/23 174 lb 8 oz (79.2 kg)  11/03/22 183 lb (83 kg)  07/20/22 183 lb (83 kg)   Patient is in no distress; well developed, nourished and groomed; neck is supple  MUSCULOSKELETAL: Gait, strength, tone, movements noted in Neurologic exam below  NEUROLOGIC: MENTAL STATUS:      No data to display            11/03/2022    9:57 AM  Montreal Cognitive Assessment   Visuospatial/ Executive (0/5) 4  Naming (0/3) 2  Attention: Read list of digits (0/2) 1  Attention: Read list of letters (0/1) 0  Attention: Serial 7 subtraction starting at 100 (0/3) 3  Language: Repeat phrase (0/2) 2  Language : Fluency (0/1) 0  Abstraction (0/2) 2  Delayed Recall (0/5) 3  Orientation (0/6) 6  Total 23   awake, alert, oriented to person, place and time recent and remote memory intact normal attention and concentration language fluent, comprehension intact, naming intact fund of knowledge appropriate  CRANIAL NERVE:  2nd, 3rd, 4th, 6th - Visual fields full to confrontation, extraocular muscles intact, no nystagmus 5th - facial sensation symmetric 7th - facial strength symmetric 8th - hearing intact 9th - palate elevates  symmetrically, uvula midline  11th - shoulder shrug symmetric 12th - tongue protrusion midline  MOTOR:  normal bulk and tone, full strength in the BUE, BLE  SENSORY:  normal and symmetric to light touch  COORDINATION:  finger-nose-finger, fine finger movements normal  GAIT/STATION:  normal   DIAGNOSTIC DATA (LABS, IMAGING, TESTING) - I reviewed patient records, labs, notes, testing and imaging myself where available.  Lab Results  Component Value Date   WBC 4.8 07/20/2022   HGB 12.8 07/20/2022   HCT 38.2 07/20/2022   MCV 88 07/20/2022   PLT 254 07/20/2022      Component Value Date/Time   NA 140 07/20/2022 1042   K 3.8 07/20/2022 1042   CL 101 07/20/2022 1042   CO2 25 07/20/2022 1042   GLUCOSE 83 07/20/2022 1042   GLUCOSE 78 08/29/2012 1219   BUN 9 07/20/2022 1042   CREATININE 0.69 07/20/2022 1042   CALCIUM 9.2 07/20/2022 1042   CALCIUM NOT PERFORMED 08/06/2010 1002   PROT 7.2 07/20/2022 1042   ALBUMIN 4.5 07/20/2022 1042   AST 17 07/20/2022 1042   ALT 14 07/20/2022 1042   ALKPHOS 52 07/20/2022 1042   BILITOT 0.7 07/20/2022 1042   GFRNONAA >90 08/29/2012 1219   GFRAA >90 08/29/2012 1219   Lab Results  Component Value Date   CHOL 206 (H) 12/07/2020   HDL 67 12/07/2020   LDLCALC 121 (H) 12/07/2020   TRIG 101 12/07/2020   CHOLHDL 3.1 12/07/2020   No results found for: "HGBA1C" Lab Results  Component Value Date   VITAMINB12 1,553 (H) 12/14/2021   Lab Results  Component Value Date   TSH 0.199 (L) 07/20/2022    CT Head 12/14/2021 No acute intracranial abnormality.     ASSESSMENT AND PLAN  53 y.o. year old female with history of chronic migraines, hypertension, hypothyroidism who is presenting for TBI and chronic migraines.  For her TBI, her symptom are better with amantadine.  Plan will be for patient to continue with amantadine. For her chronic migraine, she has tried and failed multiple preventive medication including Qulipta, Emgality,  propranolol, Topiramate, will request Botox therapy.  Advised her to contact me for worsening symptoms or any other new or any new concerns.   1. Chronic migraine w/o aura, not intractable, w/o stat migr   2. Brain fog   3. Post concussive syndrome     Patient Instructions  Continue with amantadine daily, consider decreasing to once every other day if symptoms continue to improve Continue Topamax 50 mg twice daily Continue your other medications Will request Botox therapy Sumatriptan as needed for abortive medication Return in 54-months or sooner if worse   No orders of the defined types were placed in this encounter.   Meds ordered this encounter  Medications   amantadine (SYMMETREL) 100 MG capsule    Sig: Take 1 capsule (100 mg total) by mouth daily.    Dispense:  180 capsule    Refill:  1    **Patient requests 90 days supply**   topiramate (TOPAMAX) 50 MG tablet    Sig: Take 1 tablet (50 mg total) by mouth 2 (two) times daily.    Dispense:  180 tablet    Refill:  3   SUMAtriptan (IMITREX) 100 MG tablet    Sig: One at on set of migraine or aura. May repeat in 2 hours x 1 if headache persists or recurs.    Dispense:  9 tablet    Refill:  2    Return in about 6  months (around 10/27/2023).   Windell Norfolk, MD 04/26/2023, 12:43 PM  [facility] [address] [place], [place] [address] [phone]

## 2023-04-27 NOTE — Telephone Encounter (Signed)
 Pt called in and relayed insurance info, submitted benefit verification. [id number]

## 2023-05-01 NOTE — Telephone Encounter (Signed)
 Submitted auth via CMM, status is pending. Key: [id number]

## 2023-05-03 NOTE — Telephone Encounter (Signed)
 PA kept being cancelled on CMM. I called [employer] @ [phone], spoke with Beattie. She states PA is not required, pt can be buy/bill. Reference # for call is [id number] and today's date 05/03/2023.

## 2023-05-04 NOTE — Telephone Encounter (Signed)
 Called pt to schedule Botox appt, she would like to discuss cost with her insurance company first. Provided her with billing codes and my direct number if she chooses to schedule an appointment.

## 2023-05-11 NOTE — Telephone Encounter (Signed)
 Returned pt's call and scheduled with Sarah for 06/01/23 @ 3:45 pm.

## 2023-05-18 NOTE — Telephone Encounter (Signed)
 Pt sent a MyChart msg asking to cancel Botox appt, did not wish to r/s at this time.

## 2023-06-01 ENCOUNTER — Ambulatory Visit: Admitting: Neurology

## 2023-06-01 NOTE — Telephone Encounter (Signed)
 We do not do any type sedation, I can give her a little xanax to help

## 2023-06-02 NOTE — Telephone Encounter (Signed)
 She should call back 2 days prior to her Botox appointment. I will send her some Diazepam.

## 2023-06-19 ENCOUNTER — Other Ambulatory Visit: Payer: Self-pay | Admitting: Family Medicine

## 2023-07-05 ENCOUNTER — Ambulatory Visit: Admitting: Adult Health

## 2023-08-28 ENCOUNTER — Telehealth: Payer: Self-pay | Admitting: Obstetrics and Gynecology

## 2023-08-28 ENCOUNTER — Ambulatory Visit (INDEPENDENT_AMBULATORY_CARE_PROVIDER_SITE_OTHER): Payer: Self-pay | Admitting: Obstetrics and Gynecology

## 2023-08-28 ENCOUNTER — Encounter: Payer: Self-pay | Admitting: Obstetrics and Gynecology

## 2023-08-28 VITALS — BP 125/75 | HR 98 | Ht 66.0 in | Wt 166.8 lb

## 2023-08-28 DIAGNOSIS — N993 Prolapse of vaginal vault after hysterectomy: Secondary | ICD-10-CM | POA: Insufficient documentation

## 2023-08-28 DIAGNOSIS — N393 Stress incontinence (female) (male): Secondary | ICD-10-CM | POA: Insufficient documentation

## 2023-08-28 DIAGNOSIS — N3941 Urge incontinence: Secondary | ICD-10-CM | POA: Insufficient documentation

## 2023-08-28 DIAGNOSIS — N3946 Mixed incontinence: Secondary | ICD-10-CM | POA: Diagnosis not present

## 2023-08-28 NOTE — Assessment & Plan Note (Signed)
-   Rare, will monitor

## 2023-08-28 NOTE — Assessment & Plan Note (Signed)
-   Does not currently have any SUI symptoms. Will undergo urodynamic testing to assess for SUI. We discussed options of sling or urethral bulking

## 2023-08-28 NOTE — Telephone Encounter (Signed)
 Pt called in to cancel Urodynamics testing and follow up appointments.  She just wants to schedule prolapse surgery.  She understands this will not help leakage issue.

## 2023-08-28 NOTE — Assessment & Plan Note (Signed)
 Stage II anterior, Stage II posterior, Stage I apical prolapse - For treatment of pelvic organ prolapse, we discussed options for management including expectant management, conservative management, and surgical management, such as Kegels, a pessary, pelvic floor physical therapy, and specific surgical procedures. - She is interested in surgery. We discussed two options for prolapse repair:  1) vaginal repair without mesh - Pros - safer, no mesh complications - Cons - not as strong as mesh repair, higher risk of recurrence  2) laparoscopic repair with mesh - Pros - stronger, better long-term success - Cons - risks of mesh implant (erosion into vagina or bladder, adhering to the rectum, pain) - these risks are lower than with a vaginal mesh but still exist - Due to her history of pain, would recommend prolapse repair without mesh. We discussed that she does not have apical descent on exam today but if present at the time of surgery, can also address as well. Will plan for: Anterior and posterior repair with perineorrhaphy, possible sacrospinous ligament fixation, cystoscopy

## 2023-08-28 NOTE — Progress Notes (Addendum)
 New Patient Evaluation and Consultation  Referring Provider: Gretta Gums, MD PCP: Jama Chow, MD Date of Service: 08/28/2023  SUBJECTIVE Chief Complaint: New Patient (Initial Visit) [name] is a 53 y.o. female here today for female organ prolapse.)  History of Present Illness: [name] is a 53 y.o. White or Caucasian female seen in consultation at the request of Dr Gretta for evaluation of prolapse.     Urinary Symptoms: Leaks urine with with movement to the bathroom and with urgency Rare leakage Reports SUI symptoms in the past.   Day time voids 6-8.  Nocturia: 1 times per night to void. She does have some urinary urgency  Voiding dysfunction:  empties bladder well.  Patient does not use a catheter to empty bladder.  When urinating, patient feels she has no difficulties Drinks: water only  UTIs: 0 UTI's in the last year.   Denies history of blood in urine and kidney or bladder stones   Pelvic Organ Prolapse Symptoms:                  Patient Admits to a feeling of a bulge the vaginal area. It has been present for 3 months.  Patient Denies seeing a bulge. She feels a pressure internally and during intercourse.  This bulge is bothersome.  Bowel Symptom: Bowel movements: 1 time(s) per day Stool consistency: soft  Straining: no but has to lean to the side to have a BM  Splinting: no.  Incomplete evacuation: no.  Patient Denies accidental bowel leakage / fecal incontinence Bowel regimen: other- acupuncture. Was previously doing stool softeners.    Sexual Function Sexually active: yes.  Sexual orientation: Straight Pain with sex: Yes- at the vaginal opening, feels there is a blockage  Pelvic Pain Denies pelvic pain  Past Medical History:  Past Medical History:  Diagnosis Date   Concussion    Hx of Lyme disease    recently, tx x2 weeks with antibiotic   Hypertension    Hypothyroidism    PONV (postoperative nausea and vomiting)      Past  Surgical History:   Past Surgical History:  Procedure Laterality Date   DIAGNOSTIC LAPAROSCOPY     LAPAROSCOPIC ASSISTED VAGINAL HYSTERECTOMY N/A 09/04/2012   Procedure: LAPAROSCOPIC ASSISTED VAGINAL HYSTERECTOMY;  Surgeon: Peggye Gull, MD;  Location: [facility];  Service: Gynecology;  Laterality: N/A;   PARATHYROIDECTOMY     SALPINGOOPHORECTOMY Bilateral 09/04/2012   Procedure: BILATERAL SALPINGO OOPHORECTOMY;  Surgeon: Peggye Gull, MD;  Location: [facility];  Service: Gynecology;  Laterality: Bilateral;     Past OB/GYN History: OB History  Gravida Para Term Preterm AB Living  0 0 0 0 0 0  SAB IAB Ectopic Multiple Live Births  0 0 0 0 0    S/p hysterectomy in 2014 for endometriosis   Medications: Patient has a current medication list which includes the following prescription(s): cetirizine, levothyroxine , multivitamin with minerals, OVER THE COUNTER MEDICATION, propranolol  er, sumatriptan , and topiramate .   Allergies: Patient is allergic to bacitracin, bacitracin-neomycin-polymyxin, bacitracin-polymyxin b, celebrex [celecoxib], vraylar  [cariprazine ], wellbutrin  xl [bupropion ], and neosporin [neomycin-bacitracin zn-polymyx].   Social History:  Social History   Tobacco Use   Smoking status: Former    Types: Cigarettes   Smokeless tobacco: Never  Vaping Use   Vaping status: Never Used  Substance Use Topics   Alcohol use: Not Currently    Comment: rarely-wine   Drug use: No    Relationship status: married Patient lives with her husband.  Patient is employed. Regular exercise: Yes:   History of abuse: No  Family History:   Family History  Problem Relation Age of Onset   Glaucoma Mother    Parkinson's disease Father    Scoliosis Father    Kidney cancer Maternal Grandfather        kidney and brain   Cancer Paternal Grandmother        brain   Bladder Cancer Neg Hx    Uterine cancer Neg Hx      Review of Systems: Review of Systems  Constitutional:  Negative for fever,  malaise/fatigue and weight loss.  Respiratory:  Negative for cough, shortness of breath and wheezing.   Cardiovascular:  Negative for chest pain, palpitations and leg swelling.  Gastrointestinal:  Negative for abdominal pain and blood in stool.  Genitourinary:  Negative for dysuria.  Musculoskeletal:  Negative for myalgias.  Skin:  Negative for rash.  Neurological:  Negative for dizziness and headaches.  Endo/Heme/Allergies:  Does not bruise/bleed easily.  Psychiatric/Behavioral:  Negative for depression. The patient is not nervous/anxious.      OBJECTIVE Physical Exam: Vitals:   08/28/23 1029  BP: 125/75  Pulse: 98  Weight: 166 lb 12.8 oz (75.7 kg)  Height: 5' 6 (1.676 m)    Physical Exam Vitals reviewed. Exam conducted with a chaperone present.  Constitutional:      General: She is not in acute distress. Pulmonary:     Effort: Pulmonary effort is normal.  Abdominal:     General: There is no distension.     Palpations: Abdomen is soft.     Tenderness: There is no abdominal tenderness. There is no rebound.  Musculoskeletal:        General: No swelling. Normal range of motion.  Skin:    General: Skin is warm and dry.     Findings: No rash.  Neurological:     Mental Status: She is alert and oriented to person, place, and time.  Psychiatric:        Mood and Affect: Mood normal.        Behavior: Behavior normal.      GU / Detailed Urogynecologic Evaluation:  Pelvic Exam: Normal external female genitalia; Bartholin's and Skene's glands normal in appearance; urethral meatus normal in appearance, no urethral masses or discharge.   CST: negative  s/p hysterectomy: Speculum exam reveals normal vaginal mucosa with  atrophy and normal vaginal cuff.  No masses on bimanual   Pelvic floor strength II/V, puborectalis III/V external anal sphincter III/V  Pelvic floor musculature: Right levator non-tender, Right obturator non-tender, Left levator non-tender, Left obturator  non-tender  POP-Q:   POP-Q  0                                            Aa   0                                           Ba  -5                                              C   4  Gh  5.5                                            Pb  6.5                                            tvl   -1                                            Ap  -1                                            Bp                                                 D      Rectal Exam:  Normal sphincter tone, small distal rectocele, enterocoele not present, no rectal masses, no sign of dyssynergia when asking the patient to bear down.  Post-Void Residual (PVR) by Bladder Scan: In order to evaluate bladder emptying, we discussed obtaining a postvoid residual and patient agreed to this procedure.  Procedure: The ultrasound unit was placed on the patient's abdomen in the suprapubic region after the patient had voided.    Post Void Residual - 08/28/23 1046       Post Void Residual   Post Void Residual 6 mL           Laboratory Results: No results found for: COLORU, CLARITYU, GLUCOSEUR, BILIRUBINUR, KETONESU, SPECGRAV, RBCUR, PHUR, PROTEINUR, UROBILINOGEN, LEUKOCYTESUR  Lab Results  Component Value Date   CREATININE 0.69 07/20/2022   CREATININE 0.72 12/14/2021   CREATININE 0.66 12/07/2020    No results found for: HGBA1C  Lab Results  Component Value Date   HGB 12.8 07/20/2022     ASSESSMENT AND PLAN [name] is a 53 y.o. 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 SUI (stress urinary incontinence, female)   3. Urge incontinence     Vaginal vault prolapse after hysterectomy Assessment & Plan: Stage II anterior, Stage II posterior, Stage I apical prolapse - For treatment of pelvic organ prolapse, we discussed options for management including expectant management, conservative management, and surgical  management, such as Kegels, a pessary, pelvic floor physical therapy, and specific surgical procedures. - She is interested in surgery. We discussed two options for prolapse repair:  1) vaginal repair without mesh - Pros - safer, no mesh complications - Cons - not as strong as mesh repair, higher risk of recurrence  2) laparoscopic repair with mesh - Pros - stronger, better long-term success - Cons - risks of mesh implant (erosion into vagina or bladder, adhering to the rectum, pain) - these risks are lower than with a vaginal mesh but still exist - Due to her history of pain, would recommend prolapse repair without mesh. We discussed that she does not have apical descent on  exam today but if present at the time of surgery, can also address as well. Will plan for: Anterior and posterior repair with perineorrhaphy, possible sacrospinous ligament fixation, cystoscopy   SUI (stress urinary incontinence, female) Assessment & Plan: - Does not currently have any SUI symptoms. Will undergo urodynamic testing to assess for SUI. We discussed options of sling or urethral bulking   Urge incontinence Assessment & Plan: - Rare, will monitor   Return for urodynamics and follow up after   Tiffany LOISE Caper, MD   Addendum: pt has decided that she does not want urodynamics and will address incontinence separately if needed after surgery. Will request surgery scheduling and then have patient return for pre op visit.

## 2023-08-28 NOTE — Patient Instructions (Signed)

## 2023-09-05 ENCOUNTER — Encounter: Admitting: Obstetrics and Gynecology

## 2023-09-13 ENCOUNTER — Ambulatory Visit: Payer: No Typology Code available for payment source | Admitting: Neurology

## 2023-09-18 ENCOUNTER — Ambulatory Visit: Admitting: Obstetrics and Gynecology

## 2023-11-01 ENCOUNTER — Ambulatory Visit: Payer: Self-pay | Admitting: Neurology

## 2023-11-07 ENCOUNTER — Ambulatory Visit (INDEPENDENT_AMBULATORY_CARE_PROVIDER_SITE_OTHER): Admitting: Obstetrics and Gynecology

## 2023-11-07 ENCOUNTER — Encounter: Payer: Self-pay | Admitting: Obstetrics and Gynecology

## 2023-11-07 VITALS — BP 110/79 | Ht 67.0 in | Wt 169.0 lb

## 2023-11-07 DIAGNOSIS — Z01818 Encounter for other preprocedural examination: Secondary | ICD-10-CM

## 2023-11-07 DIAGNOSIS — N993 Prolapse of vaginal vault after hysterectomy: Secondary | ICD-10-CM

## 2023-11-07 MED ORDER — POLYETHYLENE GLYCOL 3350 17 GM/SCOOP PO POWD
17.0000 g | Freq: Every day | ORAL | 0 refills | Status: AC
Start: 2023-11-07 — End: ?

## 2023-11-07 MED ORDER — IBUPROFEN 600 MG PO TABS: 600.0000 mg | ORAL_TABLET | Freq: Four times a day (QID) | ORAL | 0 refills | Status: AC | PRN

## 2023-11-07 MED ORDER — OXYCODONE HCL 5 MG PO TABS: 5.0000 mg | ORAL_TABLET | ORAL | 0 refills | Status: DC | PRN

## 2023-11-07 MED ORDER — ACETAMINOPHEN 500 MG PO TABS: 500.0000 mg | ORAL_TABLET | Freq: Four times a day (QID) | ORAL | 0 refills | Status: AC | PRN

## 2023-11-07 NOTE — H&P (Signed)
 [facility] Pre Op H&P  Subjective   History of Present Illness: [name] is a 53 y.o. female who is scheduled to undergo Anterior and posterior repair with perineorrhaphy, cystoscopy, possible sacrospinous ligament fixation  on 11/27/23.  Her symptoms include vaginal bulge, and she was was found to have Stage II anterior, Stage II posterior, Stage I apical prolapse.   Pt declined urodynamic testing  Past Medical History:  Diagnosis Date   Concussion    Hx of Lyme disease    recently, tx x2 weeks with antibiotic   Hypertension    Hypothyroidism    PONV (postoperative nausea and vomiting)      Past Surgical History:  Procedure Laterality Date   DIAGNOSTIC LAPAROSCOPY     LAPAROSCOPIC ASSISTED VAGINAL HYSTERECTOMY N/A 09/04/2012   Procedure: LAPAROSCOPIC ASSISTED VAGINAL HYSTERECTOMY;  Surgeon: Peggye Gull, MD;  Location: [facility];  Service: Gynecology;  Laterality: N/A;   PARATHYROIDECTOMY     SALPINGOOPHORECTOMY Bilateral 09/04/2012   Procedure: BILATERAL SALPINGO OOPHORECTOMY;  Surgeon: Peggye Gull, MD;  Location: [facility];  Service: Gynecology;  Laterality: Bilateral;    is allergic to bacitracin, bacitracin-neomycin-polymyxin, bacitracin-polymyxin b, celebrex [celecoxib], vraylar  [cariprazine ], wellbutrin  xl [bupropion ], and neosporin [neomycin-bacitracin zn-polymyx].   Family History  Problem Relation Age of Onset   Glaucoma Mother    Parkinson's disease Father    Scoliosis Father    Kidney cancer Maternal Grandfather        kidney and brain   Cancer Paternal Grandmother        brain   Bladder Cancer Neg Hx    Uterine cancer Neg Hx     Social History   Tobacco Use   Smoking status: Former    Types: Cigarettes   Smokeless tobacco: Never  Vaping Use   Vaping status: Never Used  Substance Use Topics   Alcohol use: Not Currently    Comment: rarely-wine   Drug use: No     Review of Systems was negative for a full 10 system review except as  noted in the History of Present Illness.  No current facility-administered medications for this encounter.  Current Outpatient Medications:    acetaminophen  (TYLENOL ) 500 MG tablet, Take 1 tablet (500 mg total) by mouth every 6 (six) hours as needed (pain)., Disp: 30 tablet, Rfl: 0   escitalopram  (LEXAPRO ) 10 MG tablet, Take 10 mg by mouth daily., Disp: , Rfl:    hydrochlorothiazide  (HYDRODIURIL ) 25 MG tablet, Take 25 mg by mouth daily., Disp: , Rfl:    ibuprofen  (ADVIL ) 600 MG tablet, Take 1 tablet (600 mg total) by mouth every 6 (six) hours as needed., Disp: 30 tablet, Rfl: 0   levothyroxine  (SYNTHROID ) 112 MCG tablet, TAKE 1 TABLET(112 MCG) BY MOUTH DAILY BEFORE BREAKFAST, Disp: 30 tablet, Rfl: 0   Multiple Vitamin (MULTIVITAMIN WITH MINERALS) TABS, Take 1 tablet by mouth daily., Disp: , Rfl:    OVER THE COUNTER MEDICATION, Multiple vitamins- [employer] integrated health supplement plan, Disp: , Rfl:    oxyCODONE  (OXY IR/ROXICODONE ) 5 MG immediate release tablet, Take 1 tablet (5 mg total) by mouth every 4 (four) hours as needed for severe pain (pain score 7-10)., Disp: 10 tablet, Rfl: 0   polyethylene glycol powder (GLYCOLAX /MIRALAX ) 17 GM/SCOOP powder, Take 17 g by mouth daily. Drink 17g (1 scoop) dissolved in water per day., Disp: 255 g, Rfl: 0   progesterone (PROMETRIUM) 100 MG capsule, Take 100 mg by mouth at bedtime., Disp: , Rfl:    propranolol   ER (INDERAL  LA) 60 MG 24 hr capsule, TAKE 1 CAPSULE(60 MG) BY MOUTH DAILY, Disp: 90 capsule, Rfl: 0   Objective There were no vitals filed for this visit.  Gen: NAD CV: S1 S2 RRR Lungs: Clear to auscultation bilaterally Abd: soft, nontender   Previous Pelvic Exam showed: POP-Q   0                                            Aa   0                                           Ba   -5                                              C    4                                            Gh   5.5                                            Pb    6.5                                            tvl    -1                                            Ap   -1                                            Bp                                                  D         Assessment/ Plan  The patient is a 53 y.o. year old with stage II POP scheduled to undergo Anterior and posterior repair with perineorrhaphy, cystoscopy, possible sacrospinous ligament fixation.     [name], MD

## 2023-11-07 NOTE — Progress Notes (Signed)
 [facility] Return visit  Subjective Chief Complaint: [name] presents for follow up  History of Present Illness: [name] is a 53 y.o. female who is scheduled to undergo Anterior and posterior repair with perineorrhaphy, cystoscopy, possible sacrospinous ligament fixation  on 11/27/23.  Her symptoms include vaginal bulge, and she was was found to have Stage II anterior, Stage II posterior, Stage I apical prolapse.   Pt declined urodynamic testing  Past Medical History:  Diagnosis Date   Concussion    Hx of Lyme disease    recently, tx x2 weeks with antibiotic   Hypertension    Hypothyroidism    PONV (postoperative nausea and vomiting)      Past Surgical History:  Procedure Laterality Date   DIAGNOSTIC LAPAROSCOPY     LAPAROSCOPIC ASSISTED VAGINAL HYSTERECTOMY N/A 09/04/2012   Procedure: LAPAROSCOPIC ASSISTED VAGINAL HYSTERECTOMY;  Surgeon: Peggye Gull, MD;  Location: [facility];  Service: Gynecology;  Laterality: N/A;   PARATHYROIDECTOMY     SALPINGOOPHORECTOMY Bilateral 09/04/2012   Procedure: BILATERAL SALPINGO OOPHORECTOMY;  Surgeon: Peggye Gull, MD;  Location: [facility];  Service: Gynecology;  Laterality: Bilateral;    is allergic to bacitracin, bacitracin-neomycin-polymyxin, bacitracin-polymyxin b, celebrex [celecoxib], vraylar  [cariprazine ], wellbutrin  xl [bupropion ], and neosporin [neomycin-bacitracin zn-polymyx].   Family History  Problem Relation Age of Onset   Glaucoma Mother    Parkinson's disease Father    Scoliosis Father    Kidney cancer Maternal Grandfather        kidney and brain   Cancer Paternal Grandmother        brain   Bladder Cancer Neg Hx    Uterine cancer Neg Hx     Social History   Tobacco Use   Smoking status: Former    Types: Cigarettes   Smokeless tobacco: Never  Vaping Use   Vaping status: Never Used  Substance Use Topics   Alcohol use: Not Currently    Comment: rarely-wine   Drug use: No     Review of  Systems was negative for a full 10 system review except as noted in the History of Present Illness.   Current Outpatient Medications:    cetirizine (ZYRTEC) 10 MG chewable tablet, Chew 10 mg by mouth daily., Disp: , Rfl:    levothyroxine  (SYNTHROID ) 112 MCG tablet, TAKE 1 TABLET(112 MCG) BY MOUTH DAILY BEFORE BREAKFAST, Disp: 30 tablet, Rfl: 0   Multiple Vitamin (MULTIVITAMIN WITH MINERALS) TABS, Take 1 tablet by mouth daily., Disp: , Rfl:    OVER THE COUNTER MEDICATION, Multiple vitamins- [employer] integrated health supplement plan, Disp: , Rfl:    propranolol  ER (INDERAL  LA) 60 MG 24 hr capsule, TAKE 1 CAPSULE(60 MG) BY MOUTH DAILY, Disp: 90 capsule, Rfl: 0   SUMAtriptan  (IMITREX ) 100 MG tablet, One at on set of migraine or aura. May repeat in 2 hours x 1 if headache persists or recurs., Disp: 9 tablet, Rfl: 2   topiramate  (TOPAMAX ) 50 MG tablet, Take 1 tablet (50 mg total) by mouth 2 (two) times daily., Disp: 180 tablet, Rfl: 3   Objective There were no vitals filed for this visit.  Gen: NAD CV: S1 S2 RRR Lungs: Clear to auscultation bilaterally Abd: soft, nontender   Previous Pelvic Exam showed: POP-Q   0  Aa   0                                           Ba   -5                                              C    4                                            Gh   5.5                                            Pb   6.5                                            tvl    -1                                            Ap   -1                                            Bp                                                  D         Assessment/ Plan  Assessment: The patient is a 53 y.o. year old scheduled to undergo Anterior and posterior repair with perineorrhaphy, cystoscopy, possible sacrospinous ligament fixation.   Plan:  - We discussed that we were unable to evaluate for occult SUI since she declined urodynamic testing.  She understands that she may develop urinary leakage after the procedure, and this may require further evaluation and treatment post-operatively.   General Surgical Consent: The patient has previously been counseled on alternative treatments, and the decision by the patient and provider was to proceed with the procedure listed above.  For all procedures, there are risks of bleeding, infection, damage to surrounding organs including but not limited to bowel, bladder, blood vessels, ureters and nerves, and need for further surgery if an injury were to occur. These risks are all low with minimally invasive surgery.   There are risks of numbness and weakness at any body site or buttock/rectal pain.  It is possible that baseline pain can be worsened by surgery, either with or without mesh. If surgery is vaginal, there is also a low risk of possible conversion to laparoscopy or open abdominal incision where indicated. Very rare risks include blood transfusion, blood clot, heart attack, pneumonia,  or death.   There is also a risk of short-term postoperative urinary retention with need to use a catheter. About half of patients need to go home from surgery with a catheter, which is then later removed in the office. The risk of long-term need for a catheter is very low. There is also a risk of worsening of overactive bladder.    Prolapse (with or without mesh): Risk factors for surgical failure  include things that put pressure on your pelvis and the surgical repair, including obesity, chronic cough, and heavy lifting or straining (including lifting children or adults, straining on the toilet, or lifting heavy objects such as furniture or anything weighing >25 lbs. Risks of recurrence is 20-30% with vaginal native tissue repair and a less than 10% with sacrocolpopexy with mesh.    We discussed consent for blood products. Risks for blood transfusion include allergic reactions, other reactions that can affect  different body organs and managed accordingly, transmission of infectious diseases such as HIV or Hepatitis. However, the blood is screened. Patient consents for blood products.  Pre-operative instructions:  She was instructed to not take Aspirin/NSAIDs x 7days prior to surgery.  Antibiotic prophylaxis was ordered as indicated.  Catheter use: Patient will go home with foley if needed after post-operative voiding trial.  Post-operative instructions:  She was provided with specific post-operative instructions, including precautions and signs/symptoms for which we would recommend contacting us , in addition to daytime and after-hours contact phone numbers. This was provided on a handout.   Post-operative medications: Prescriptions for motrin , tylenol , miralax , and oxycodone  were sent to her pharmacy. Discussed using ibuprofen  and tylenol  on a schedule to limit use of narcotics.   Laboratory testing:  no labs needed  Preoperative clearance:  She does not require surgical clearance.    Post-operative follow-up:  A post-operative appointment will be made for 6 weeks from the date of surgery. If she needs a post-operative nurse visit for a voiding trial, that will be set up after she leaves the hospital.    Patient will call the clinic or use MyChart should anything change or any new issues arise.   [name], MD  Time spent: I spent 20 minutes dedicated to the care of this patient on the date of this encounter to include pre-visit review of records, face-to-face time with the patient and post visit documentation.

## 2023-11-22 ENCOUNTER — Encounter (HOSPITAL_COMMUNITY): Payer: Self-pay | Admitting: Obstetrics and Gynecology

## 2023-11-22 NOTE — Progress Notes (Signed)
 Spoke w/ via phone for pre-op interview--- [name] needs dos----  BMP and EKG per anesthesia.       Mooney results------ COVID test -----patient states asymptomatic no test needed Arrive at -------0930 NPO after MN NO Solid Food.  Clear liquids from MN until---0830 Pre-Surgery Ensure or G2:  Med rec completed Medications to take morning of surgery -----Levothyroxine , Liothyronine, Propranolol  and Estridiol  Diabetic medication -----  GLP1 agonist last dose: GLP1 instructions:  Patient instructed no nail polish to be worn day of surgery Patient instructed to bring photo id and insurance card day of surgery Patient aware to have Driver (ride ) / caregiver    for 24 hours after surgery - Husband [name] Patient Special Instructions ----- Pre-Op special Instructions -----  Patient verbalized understanding of instructions that were given at this phone interview. Patient denies chest pain, sob, fever, cough at the interview.

## 2023-11-27 ENCOUNTER — Encounter: Payer: Self-pay | Admitting: Obstetrics and Gynecology

## 2023-11-27 ENCOUNTER — Encounter (HOSPITAL_COMMUNITY): Payer: Self-pay | Admitting: Obstetrics and Gynecology

## 2023-11-27 ENCOUNTER — Ambulatory Visit (HOSPITAL_COMMUNITY)
Admission: RE | Admit: 2023-11-27 | Discharge: 2023-11-27 | Disposition: A | Discharge status: Home / Self Care | Attending: Obstetrics and Gynecology | Admitting: Obstetrics and Gynecology

## 2023-11-27 ENCOUNTER — Other Ambulatory Visit: Payer: Self-pay

## 2023-11-27 ENCOUNTER — Ambulatory Visit (HOSPITAL_COMMUNITY)

## 2023-11-27 ENCOUNTER — Telehealth: Payer: Self-pay | Admitting: Obstetrics and Gynecology

## 2023-11-27 ENCOUNTER — Encounter (HOSPITAL_COMMUNITY)
Admission: RE | Disposition: A | Discharge status: Home / Self Care | Payer: Self-pay | Source: Home / Self Care | Attending: Obstetrics and Gynecology

## 2023-11-27 ENCOUNTER — Ambulatory Visit (HOSPITAL_BASED_OUTPATIENT_CLINIC_OR_DEPARTMENT_OTHER)

## 2023-11-27 DIAGNOSIS — Z79890 Hormone replacement therapy (postmenopausal): Secondary | ICD-10-CM | POA: Diagnosis not present

## 2023-11-27 DIAGNOSIS — N993 Prolapse of vaginal vault after hysterectomy: Secondary | ICD-10-CM | POA: Diagnosis not present

## 2023-11-27 DIAGNOSIS — F419 Anxiety disorder, unspecified: Secondary | ICD-10-CM | POA: Insufficient documentation

## 2023-11-27 DIAGNOSIS — E039 Hypothyroidism, unspecified: Secondary | ICD-10-CM | POA: Insufficient documentation

## 2023-11-27 DIAGNOSIS — I10 Essential (primary) hypertension: Secondary | ICD-10-CM | POA: Diagnosis not present

## 2023-11-27 DIAGNOSIS — F32A Depression, unspecified: Secondary | ICD-10-CM | POA: Insufficient documentation

## 2023-11-27 DIAGNOSIS — Z87891 Personal history of nicotine dependence: Secondary | ICD-10-CM | POA: Insufficient documentation

## 2023-11-27 DIAGNOSIS — N816 Rectocele: Secondary | ICD-10-CM | POA: Diagnosis not present

## 2023-11-27 HISTORY — DX: Bipolar disorder, unspecified: F31.9

## 2023-11-27 HISTORY — DX: Anxiety disorder, unspecified: F41.9

## 2023-11-27 HISTORY — PX: CYSTOSCOPY: SHX5120

## 2023-11-27 HISTORY — PX: PERINEOPLASTY: SHX2218

## 2023-11-27 HISTORY — PX: ANTERIOR (CYSTOCELE) AND POSTERIOR REPAIR (RECTOCELE) WITH XENFORM GRAFT AND SACROSPINOUS FIXATION: SHX6492

## 2023-11-27 HISTORY — DX: Depression, unspecified: F32.A

## 2023-11-27 LAB — BASIC METABOLIC PANEL WITH GFR
Anion gap: 9 (ref 5–15)
BUN: 16 mg/dL (ref 6–20)
CO2: 23 mmol/L (ref 22–32)
Calcium: 9.2 mg/dL (ref 8.9–10.3)
Chloride: 107 mmol/L (ref 98–111)
Creatinine, Ser: 0.83 mg/dL (ref 0.44–1.00)
GFR, Estimated: >60 mL/min (ref >60)
Glucose, Bld: 92 mg/dL (ref 70–99)
Potassium: 3.4 mmol/L — ABNORMAL LOW (ref 3.5–5.1)
Sodium: 139 mmol/L (ref 135–145)

## 2023-11-27 SURGERY — ANTERIOR (CYSTOCELE) AND POSTERIOR REPAIR (RECTOCELE) WITH XENFORM GRAFT AND SACROSPINOUS FIXATION
Anesthesia: General

## 2023-11-27 MED ORDER — PROPOFOL 10 MG/ML IV BOLUS
INTRAVENOUS | Status: AC
  Filled 2023-11-27: qty 20

## 2023-11-27 MED ORDER — ROCURONIUM BROMIDE 10 MG/ML (PF) SYRINGE
PREFILLED_SYRINGE | INTRAVENOUS | Status: AC
  Filled 2023-11-27: qty 10

## 2023-11-27 MED ORDER — SUGAMMADEX SODIUM 200 MG/2ML IV SOLN
INTRAVENOUS | Status: DC | PRN
  Administered 2023-11-27: 150 mg via INTRAVENOUS

## 2023-11-27 MED ORDER — OXYCODONE HCL 5 MG/5ML PO SOLN: 5.0000 mg | Freq: Once | ORAL | Status: DC | PRN

## 2023-11-27 MED ORDER — ACETAMINOPHEN 500 MG PO TABS
1000.0000 mg | ORAL_TABLET | ORAL | Status: AC
  Administered 2023-11-27: 1000 mg via ORAL

## 2023-11-27 MED ORDER — OXYCODONE HCL 5 MG PO TABS: 5.0000 mg | ORAL_TABLET | Freq: Once | ORAL | Status: DC | PRN

## 2023-11-27 MED ORDER — PHENAZOPYRIDINE HCL 100 MG PO TABS
200.0000 mg | ORAL_TABLET | ORAL | Status: AC
  Administered 2023-11-27: 200 mg via ORAL

## 2023-11-27 MED ORDER — EPHEDRINE SULFATE-NACL 50-0.9 MG/10ML-% IV SOSY
PREFILLED_SYRINGE | INTRAVENOUS | Status: DC | PRN
  Administered 2023-11-27: 5 mg via INTRAVENOUS

## 2023-11-27 MED ORDER — ONDANSETRON HCL 4 MG/2ML IJ SOLN
INTRAMUSCULAR | Status: AC
  Filled 2023-11-27: qty 2

## 2023-11-27 MED ORDER — SODIUM CHLORIDE 0.9 % IV SOLN
12.5000 mg | INTRAVENOUS | Status: DC | PRN
  Filled 2023-11-27: qty 0.5

## 2023-11-27 MED ORDER — AMISULPRIDE (ANTIEMETIC) 5 MG/2ML IV SOLN: 10.0000 mg | Freq: Once | INTRAVENOUS | Status: DC | PRN

## 2023-11-27 MED ORDER — PROPOFOL 10 MG/ML IV BOLUS
INTRAVENOUS | Status: DC | PRN
  Administered 2023-11-27: 150 mg via INTRAVENOUS

## 2023-11-27 MED ORDER — LACTATED RINGERS IV SOLN: INTRAVENOUS | Status: DC

## 2023-11-27 MED ORDER — LIDOCAINE 2% (20 MG/ML) 5 ML SYRINGE
INTRAMUSCULAR | Status: AC
  Filled 2023-11-27: qty 5

## 2023-11-27 MED ORDER — ORAL CARE MOUTH RINSE: 15.0000 mL | Freq: Once | OROMUCOSAL | Status: AC

## 2023-11-27 MED ORDER — CEFAZOLIN SODIUM-DEXTROSE 2-4 GM/100ML-% IV SOLN
2.0000 g | INTRAVENOUS | Status: AC
  Administered 2023-11-27: 2 g via INTRAVENOUS

## 2023-11-27 MED ORDER — ONDANSETRON HCL 4 MG/2ML IJ SOLN
INTRAMUSCULAR | Status: DC | PRN
  Administered 2023-11-27: 4 mg via INTRAVENOUS

## 2023-11-27 MED ORDER — DEXAMETHASONE SODIUM PHOSPHATE 10 MG/ML IJ SOLN
INTRAMUSCULAR | Status: DC | PRN
Start: 2023-11-27 — End: 2023-11-27
  Administered 2023-11-27: 10 mg via INTRAVENOUS

## 2023-11-27 MED ORDER — HYDROMORPHONE HCL 1 MG/ML IJ SOLN: 0.2500 mg | INTRAMUSCULAR | Status: DC | PRN

## 2023-11-27 MED ORDER — ACETAMINOPHEN 500 MG PO TABS
ORAL_TABLET | ORAL | Status: AC
  Filled 2023-11-27: qty 2

## 2023-11-27 MED ORDER — SCOPOLAMINE 1 MG/3DAYS TD PT72
MEDICATED_PATCH | TRANSDERMAL | Status: AC
  Filled 2023-11-27: qty 1

## 2023-11-27 MED ORDER — SCOPOLAMINE 1 MG/3DAYS TD PT72
1.0000 | MEDICATED_PATCH | Freq: Once | TRANSDERMAL | Status: DC
  Administered 2023-11-27: 1 mg via TRANSDERMAL

## 2023-11-27 MED ORDER — PHENYLEPHRINE 80 MCG/ML (10ML) SYRINGE FOR IV PUSH (FOR BLOOD PRESSURE SUPPORT)
PREFILLED_SYRINGE | INTRAVENOUS | Status: DC | PRN
  Administered 2023-11-27 (×4): 80 ug via INTRAVENOUS

## 2023-11-27 MED ORDER — DEXAMETHASONE SODIUM PHOSPHATE 10 MG/ML IJ SOLN
INTRAMUSCULAR | Status: AC
  Filled 2023-11-27: qty 1

## 2023-11-27 MED ORDER — MIDAZOLAM HCL 2 MG/2ML IJ SOLN
INTRAMUSCULAR | Status: DC | PRN
  Administered 2023-11-27: 2 mg via INTRAVENOUS

## 2023-11-27 MED ORDER — SODIUM CHLORIDE 0.9 % IR SOLN
Status: DC | PRN
  Administered 2023-11-27: 1000 mL via INTRAVESICAL

## 2023-11-27 MED ORDER — ORAL CARE MOUTH RINSE: 15.0000 mL | Freq: Once | OROMUCOSAL | Status: DC

## 2023-11-27 MED ORDER — 0.9 % SODIUM CHLORIDE (POUR BTL) OPTIME
TOPICAL | Status: DC | PRN
  Administered 2023-11-27: 1000 mL

## 2023-11-27 MED ORDER — CEFAZOLIN SODIUM-DEXTROSE 2-4 GM/100ML-% IV SOLN
INTRAVENOUS | Status: AC
  Filled 2023-11-27: qty 100

## 2023-11-27 MED ORDER — LIDOCAINE 2% (20 MG/ML) 5 ML SYRINGE
INTRAMUSCULAR | Status: DC | PRN
  Administered 2023-11-27: 100 mg via INTRAVENOUS

## 2023-11-27 MED ORDER — LIDOCAINE-EPINEPHRINE 1 %-1:100000 IJ SOLN
INTRAMUSCULAR | Status: DC | PRN
  Administered 2023-11-27: 23 mL

## 2023-11-27 MED ORDER — FENTANYL CITRATE (PF) 250 MCG/5ML IJ SOLN
INTRAMUSCULAR | Status: DC | PRN
  Administered 2023-11-27: 100 ug via INTRAVENOUS

## 2023-11-27 MED ORDER — CHLORHEXIDINE GLUCONATE 0.12 % MT SOLN: 15.0000 mL | Freq: Once | OROMUCOSAL | Status: DC

## 2023-11-27 MED ORDER — MIDAZOLAM HCL 2 MG/2ML IJ SOLN
INTRAMUSCULAR | Status: AC
  Filled 2023-11-27: qty 2

## 2023-11-27 MED ORDER — CHLORHEXIDINE GLUCONATE 0.12 % MT SOLN
15.0000 mL | Freq: Once | OROMUCOSAL | Status: AC
  Administered 2023-11-27: 15 mL via OROMUCOSAL

## 2023-11-27 MED ORDER — ROCURONIUM BROMIDE 10 MG/ML (PF) SYRINGE
PREFILLED_SYRINGE | INTRAVENOUS | Status: DC | PRN
  Administered 2023-11-27: 50 mg via INTRAVENOUS
  Administered 2023-11-27 (×2): 10 mg via INTRAVENOUS

## 2023-11-27 MED ORDER — CHLORHEXIDINE GLUCONATE 0.12 % MT SOLN
OROMUCOSAL | Status: AC
  Filled 2023-11-27: qty 15

## 2023-11-27 MED ORDER — MEPERIDINE HCL 25 MG/ML IJ SOLN
6.2500 mg | INTRAMUSCULAR | Status: DC | PRN
  Filled 2023-11-27: qty 1

## 2023-11-27 MED ORDER — PHENAZOPYRIDINE HCL 100 MG PO TABS
ORAL_TABLET | ORAL | Status: AC
  Filled 2023-11-27: qty 2

## 2023-11-27 MED ORDER — FENTANYL CITRATE (PF) 250 MCG/5ML IJ SOLN
INTRAMUSCULAR | Status: AC
  Filled 2023-11-27: qty 5

## 2023-11-27 SURGICAL SUPPLY — 33 items
BLADE SURG 15 STRL LF DISP TIS (BLADE) ×2 IMPLANT
DEVICE CAPIO SLIM SINGLE (INSTRUMENTS) ×2 IMPLANT
DRSG TEGADERM 4X4.75 (GAUZE/BANDAGES/DRESSINGS) IMPLANT
GAUZE 4X4 16PLY ~~LOC~~+RFID DBL (SPONGE) IMPLANT
GLOVE BIOGEL PI IND STRL 6.5 (GLOVE) ×2 IMPLANT
GLOVE BIOGEL PI IND STRL 7.0 (GLOVE) ×4 IMPLANT
GLOVE ECLIPSE 6.0 STRL STRAW (GLOVE) ×2 IMPLANT
GOWN STRL REUS W/ TWL LRG LVL3 (GOWN DISPOSABLE) ×2 IMPLANT
HIBICLENS CHG 4% 4OZ BTL (MISCELLANEOUS) ×2 IMPLANT
HOLDER FOLEY CATH W/STRAP (MISCELLANEOUS) ×2 IMPLANT
IV 0.9% NACL 1000 ML (IV SOLUTION) IMPLANT
KIT TURNOVER KIT B (KITS) ×2 IMPLANT
NDL HYPO 22X1.5 SAFETY MO (MISCELLANEOUS) ×2 IMPLANT
NDL MAYO 6 CRC TAPER PT (NEEDLE) ×2 IMPLANT
NEEDLE HYPO 22X1.5 SAFETY MO (MISCELLANEOUS) ×2 IMPLANT
NEEDLE MAYO 6 CRC TAPER PT (NEEDLE) ×2 IMPLANT
PACK VAGINAL WOMENS (CUSTOM PROCEDURE TRAY) ×2 IMPLANT
PAD OB MATERNITY 11 LF (PERSONAL CARE ITEMS) ×2 IMPLANT
RETRACTOR LONE STAR DISPOSABLE (INSTRUMENTS) ×2 IMPLANT
RETRACTOR STAY HOOK 5MM (MISCELLANEOUS) ×2 IMPLANT
SET CYSTO W/LG BORE CLAMP LF (SET/KITS/TRAYS/PACK) ×2 IMPLANT
SLEEVE SCD COMPRESS KNEE MED (STOCKING) ×2 IMPLANT
SOLN 0.9% NACL 1000 ML (IV SOLUTION) ×2 IMPLANT
SOLN 0.9% NACL POUR BTL 1000ML (IV SOLUTION) ×2 IMPLANT
SUCTION TUBE FRAZIER 10FR DISP (SUCTIONS) ×2 IMPLANT
SURGIFLO W/THROMBIN 8M KIT (HEMOSTASIS) IMPLANT
SUT ABS MONO DBL WITH NDL 48IN (SUTURE) ×4 IMPLANT
SUT VIC AB 0 CT1 27XBRD ANBCTR (SUTURE) ×2 IMPLANT
SUT VIC AB 2-0 SH 27XBRD (SUTURE) ×2 IMPLANT
SUT VICRYL 2-0 SH 8X27 (SUTURE) ×2 IMPLANT
SYR BULB EAR ULCER 3OZ GRN STR (SYRINGE) ×2 IMPLANT
TOWEL GREEN STERILE (TOWEL DISPOSABLE) ×2 IMPLANT
TRAY FOLEY W/BAG SLVR 14FR LF (SET/KITS/TRAYS/PACK) ×2 IMPLANT

## 2023-11-27 NOTE — Anesthesia Preprocedure Evaluation (Addendum)
 Anesthesia Evaluation  Patient identified by MRN, date of birth, ID band Patient awake    Reviewed: Allergy & Precautions, H&P , Patient's Chart, lab work & pertinent test results, reviewed documented beta blocker date and time   History of Anesthesia Complications (+) PONV and history of anesthetic complications  Airway Mallampati: II  TM Distance: >3 FB Neck ROM: full    Dental no notable dental hx.    Pulmonary former smoker   Pulmonary exam normal breath sounds clear to auscultation       Cardiovascular hypertension,  Rhythm:regular Rate:Normal     Neuro/Psych  Headaches  Anxiety Depression Bipolar Disorder      GI/Hepatic   Endo/Other  Hypothyroidism    Renal/GU      Musculoskeletal   Abdominal   Peds  Hematology   Anesthesia Other Findings   Reproductive/Obstetrics                              Anesthesia Physical Anesthesia Plan  ASA: II  Anesthesia Plan: General   Post-op Pain Management: Tylenol  PO (pre-op)*   Induction: Intravenous  PONV Risk Score and Plan: 4 or greater and Ondansetron , Dexamethasone , Midazolam , Scopolamine  patch - Pre-op and Treatment may vary due to age or medical condition  Airway Management Planned: Oral ETT  Additional Equipment:   Intra-op Plan:   Post-operative Plan: Extubation in OR  Informed Consent: I have reviewed the patients History and Physical, chart, labs and discussed the procedure including the risks, benefits and alternatives for the proposed anesthesia with the patient or authorized representative who has indicated his/her understanding and acceptance.     Dental Advisory Given and Dental advisory given  Plan Discussed with: CRNA and Surgeon  Anesthesia Plan Comments: (  Discussed  general anesthesia, including possible nausea, instrumentation of airway, sore throat,pulmonary aspiration, etc. I asked if the were any  outstanding questions, or  concerns before we proceeded. )         Anesthesia Quick Evaluation

## 2023-11-27 NOTE — Op Note (Signed)
 Operative Note  Preoperative Diagnosis: anterior vaginal prolapse, posterior vaginal prolapse, and vaginal vault prolapse after hysterectomy  Postoperative Diagnosis: same  Procedures performed:  Anterior and posterior repair with perineorrhaphy, sacrospinous ligament fixation, cystoscopy  Implants: none  Attending Surgeon: Rosaline Caper, MD  Anesthesia: General endotracheal  Findings: 1. On vaginal exam, stage II prolapse present  2. On cystoscopy, normal bladder and urethral mucosa without injury or lesion. Brisk bilateral ureteral efflux present.    Specimens: none  Estimated blood loss: 30 mL  IV fluids: 1000 mL  Urine output: 150 mL  Complications: none  Procedure in Detail:  After informed consent was obtained, the patient was taken to the operating room where anesthesia was induced and found to be adequate. She was placed in dorsal lithotomy position, taking care to avoid any traction on the extremities, and then prepped and draped in the usual sterile fashion. A self-retaining lonestar retractor was placed using four elastic blue stays.  After a foley catheter was inserted into the urethra, the location of the midurethra was palpated. Two Allis clamps were along the anterior vaginal wall defect. 1% lidocaine  with epinephrine  was injected into the vaginal mucosa.  A vertical incision was made between these two Allis clamps with a 15 blade scalpel.  Allis clamps were placed along this incision and Metzenbaum scissors were used to undermine the vaginal mucosa along the incision.  The vaginal mucosa was then sharply dissected off to the vesicovaginal septum bilaterally to the level of the pubic rami.    For the sacrospinous ligament fixation (SSLF), the ischial spine was accessed on the right side via dissection with Metzenbaum scissors and blunt dissection.  The sacrospinous ligament was palpated. Two 0 PDS suture was then placed at the sacrospinous ligament two  fingerbreadths medial to the ischial spine, in order to avoid the pudendal neurovascular bundle, using a Capio needle driver.  The PDS suture was attached to the vaginal epithelium on the ipsilateral side of the vaginal apex and held. Anterior plication of the vesicovaginal septum was then performed using mattress sutures of 2-0 Vicryl. The vaginal mucosal edges were trimmed and the incision reapproximated with 2-0 Vicryl in a running fashion. The SSLF suture was then tied down with excellent support of the anterior and apical vagina.  The Foley catheter was removed.  A 70-degree cystoscope was introduced, and 360-degree inspection revealed no trauma to the bladder, with bilateral ureteral efflux.  The bladder was drained and the cystoscope was removed.  The Foley catheter was reinserted.   Attention was then turned to the posterior vagina.  Two Allis clamps were in the midline of the posterior vaginal wall defect.  1% lidocaine  with epinephrine  was injected into the vaginal mucosa. A vertical incision was made between these clamps with a 15 blade scalpel and a diamond shaped incision was made over the perineum. Excess perineal tissue was removed with a scalpel.   The rectovaginal septum was then dissected off the vaginal mucosa bilaterally.  No enterocele was noted.  The rectovaginal septum was then plicated with mattress sutures of 2-0 Vicryl.  After placement of the first plication stitch two fingers were inserted into the vaginal to confirm adequate caliber.  The last distal stitch incorporated the perineal body in a U stitch fashion.  After plication, the excess vaginal mucosa was trimmed and the vaginal mucosa was reapproximated using 2-0 Vicryl sutures in a running fashion.  The perineal body was then reapproximated with two interrupted 0-vicryl sutures. The perineal skin  was then closed with a 2-0 vicryl in a subcutaneous and subcuticular fashion. Irrigation was performed and good hemostasis was noted.  Vaginal packing was not placed.  A rectal examination was normal and confirmed no sutures within the rectum.  The patient tolerated the procedure well.  She was awakened from anesthesia and transferred to the recovery room in stable condition. All counts were correct x 2.    [name], MD

## 2023-11-27 NOTE — Anesthesia Procedure Notes (Signed)
 Procedure Name: Intubation Date/Time: 11/27/2023 10:10 AM  Performed by: Gaberiel Youngblood C, CRNAPre-anesthesia Checklist: Patient identified, Emergency Drugs available, Suction available and Patient being monitored Patient Re-evaluated:Patient Re-evaluated prior to induction Oxygen Delivery Method: Circle system utilized Preoxygenation: Pre-oxygenation with 100% oxygen Induction Type: IV induction Ventilation: Mask ventilation without difficulty Laryngoscope Size: Mac and 3 Grade View: Grade I Tube type: Oral Tube size: 7.0 mm Number of attempts: 1 Airway Equipment and Method: Stylet and Oral airway Placement Confirmation: ETT inserted through vocal cords under direct vision, positive ETCO2 and breath sounds checked- equal and bilateral Secured at: 21 cm Tube secured with: Tape Dental Injury: Teeth and Oropharynx as per pre-operative assessment

## 2023-11-27 NOTE — Interval H&P Note (Signed)
 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  The various methods of treatment have been discussed with the patient and family. After consideration of risks, benefits and other options for treatment, the patient has consented to  Procedure(s): ANTERIOR (CYSTOCELE) AND POSTERIOR REPAIR (RECTOCELE) WITH POSSIBLE SACROSPINOUS FIXATION (N/A) PERINEORRHAPHY (N/A) CYSTOSCOPY as a surgical intervention.  The patient's history has been reviewed, patient examined, no change in status, stable for surgery.  I have reviewed the patient's chart and labs.  Questions were answered to the patient's satisfaction.     [name]

## 2023-11-27 NOTE — Discharge Instructions (Addendum)
POST OPERATIVE INSTRUCTIONS  General Instructions Recovery (not bed rest) will last approximately 6 weeks Walking is encouraged, but refrain from strenuous exercise/ housework/ heavy lifting. No lifting >10lbs  Nothing in the vagina- NO intercourse, tampons or douching Bathing:  Do not submerge in water (NO swimming, bath, hot tub, etc) until after your postop visit. You can shower starting the day after surgery.  No driving until you are not taking narcotic pain medicine and until your pain is well enough controlled that you can slam on the breaks or make sudden movements if needed.   Taking your medications Please take your acetaminophen and ibuprofen on a schedule for the first 48 hours. Take 600mg ibuprofen, then take 500mg acetaminophen 3 hours later, then continue to alternate ibuprofen and acetaminophen. That way you are taking each type of medication every 6 hours. Take the prescribed narcotic (oxycodone, tramadol, etc) as needed, with a maximum being every 4 hours.  Take a stool softener daily to keep your stools soft and preventing you from straining. If you have diarrhea, you decrease your stool softener. This is explained more below. We have prescribed you Miralax.  Reasons to Call the Nurse (see last page for phone numbers) Heavy Bleeding (changing your pad every 1-2 hours) Persistent nausea/vomiting Fever (100.4 degrees or more) Incision problems (pus or other fluid coming out, redness, warmth, increased pain)  Things to Expect After Surgery Mild to Moderate pain is normal during the first day or two after surgery. If prescribed, take Ibuprofen or Tylenol first and use the stronger medicine for "break-through" pain. You can overlap these medicines because they work differently.   Constipation   To Prevent Constipation:  Eat a well-balanced diet including protein, grains, fresh fruit and vegetables.  Drink plenty of fluids. Walk regularly.  Depending on specific instructions  from your physician: take Miralax daily and additionally you can add a stool softener (colace/ docusate) and fiber supplement. Continue as long as you're on pain medications.   To Treat Constipation:  If you do not have a bowel movement in 2 days after surgery, you can take 2 Tbs of Milk of Magnesia 1-2 times a day until you have a bowel movement. If diarrhea occurs, decrease the amount or stop the laxative. If no results with Milk of Magnesia, you can drink a bottle of magnesium citrate which you can purchase over the counter.  Fatigue:  This is a normal response to surgery and will improve with time.  Plan frequent rest periods throughout the day.  Gas Pain:  This is very common but can also be very painful! Drink warm liquids such as herbal teas, bouillon or soup. Walking will help you pass more gas.  Mylicon or Gas-X can be taken over the counter.  Leaking Urine:  Varying amounts of leakage may occur after surgery.  This should improve with time. Your bladder needs at least 3 months to recover from surgery. If you leak after surgery, be sure to mention this to your doctor at your post-op visit. If you were taking medications for overactive bladder prior to surgery, be sure to restart the medications immediately after surgery.  Incisions: If you have incisions on your abdomen, the skin glue will dissolve on its own over time. It is ok to gently rinse with soap and water over these incisions but do not scrub.  Catheter Approximately 50% of patients are unable to urinate after surgery and need to go home with a catheter. This allows your bladder to   rest so it can return to full function. If you go home with a catheter, the office will call to set up a voiding trial a few days after surgery. For most patients, by this visit, they are able to urinate on their own. Long term catheter use is rare.   Return to Work  As work demands and recovery times vary widely, it is hard to predict when you will want  to return to work. If you have a desk job with no strenuous physical activity, and if you would like to return sooner than generally recommended, discuss this with your provider or call our office.   Post op concerns  For non-emergent issues, please call the Urogynecology Nurse. Please leave a message and someone will contact you within one business day.  You can also send a message through MyChart.   AFTER HOURS (After 5:00 PM and on weekends):  For urgent matters that cannot wait until the next business day. Call our office [phone] and connect to the doctor on call.  Please reserve this for important issues.   **FOR ANY TRUE EMERGENCY ISSUES CALL 911 OR GO TO THE NEAREST EMERGENCY ROOM.** Please inform our office or the doctor on call of any emergency.     APPOINTMENTS: Call [phone]  Post Anesthesia Home Care Instructions  Activity: Get plenty of rest for the remainder of the day. A responsible adult should stay with you for 24 hours following the procedure.  For the next 24 hours, DO NOT: -Drive a car -Operate machinery -Drink alcoholic beverages -Take any medication unless instructed by your physician -Make any legal decisions or sign important papers.  Meals: Start with liquid foods such as gelatin or soup. Progress to regular foods as tolerated. Avoid greasy, spicy, heavy foods. If nausea and/or vomiting occur, drink only clear liquids until the nausea and/or vomiting subsides. Call your physician if vomiting continues.  Special Instructions/Symptoms: Your throat may feel dry or sore from the anesthesia or the breathing tube placed in your throat during surgery. If this causes discomfort, gargle with warm salt water. The discomfort should disappear within 24 hours.  If you had a scopolamine patch placed behind your ear for the management of post- operative nausea and/or vomiting:  1. The medication in the patch is effective for 72 hours, after which it should be  removed.  Wrap patch in a tissue and discard in the trash. Wash hands thoroughly with soap and water. 2. You may remove the patch earlier than 72 hours if you experience unpleasant side effects which may include dry mouth, dizziness or visual disturbances. 3. Avoid touching the patch. Wash your hands with soap and water after contact with the patch.   

## 2023-11-27 NOTE — Transfer of Care (Signed)
 Immediate Anesthesia Transfer of Care Note  Patient: [name]  Procedure(s) Performed: ANTERIOR (CYSTOCELE) AND POSTERIOR REPAIR (RECTOCELE) WITH SACROSPINOUS FIXATION PERINEORRHAPHY CYSTOSCOPY  Patient Location: PACU  Anesthesia Type:General  Level of Consciousness: awake, alert , and sedated  Airway & Oxygen Therapy: Patient Spontanous Breathing and Patient connected to nasal cannula oxygen  Post-op Assessment: Report given to RN and Post -op Vital signs reviewed and stable  Post vital signs: Reviewed and stable  Last Vitals:  Vitals Value Taken Time  BP 104/76 11/27/23 11:49  Temp    Pulse 68 11/27/23 11:50  Resp 18 11/27/23 11:50  SpO2 100 % 11/27/23 11:50  Vitals shown include unfiled device data.  Last Pain:  Vitals:   11/27/23 0906  TempSrc: Oral  PainSc: 0-No pain      Patients Stated Pain Goal: 5 (11/27/23 0906)  Complications: No notable events documented.

## 2023-11-27 NOTE — Telephone Encounter (Signed)
 [name] underwent Anterior and posterior repair with perineorrhaphy, sacrospinous ligament fixation, cystoscopy on 11/27/23.   She passed her voiding trial.  was backfilled into the bladder Voided  PVR by bladder scan was .   She was discharged without a catheter. Please call her for a routine post op check. Thanks!  [name], MD

## 2023-11-27 NOTE — Anesthesia Postprocedure Evaluation (Signed)
 Anesthesia Post Note  Patient: [name]  Procedure(s) Performed: ANTERIOR (CYSTOCELE) AND POSTERIOR REPAIR (RECTOCELE) WITH SACROSPINOUS FIXATION PERINEORRHAPHY CYSTOSCOPY     Patient location during evaluation: PACU Anesthesia Type: General Level of consciousness: awake and alert Pain management: pain level controlled Vital Signs Assessment: post-procedure vital signs reviewed and stable Respiratory status: spontaneous breathing, nonlabored ventilation and respiratory function stable Cardiovascular status: blood pressure returned to baseline and stable Postop Assessment: no apparent nausea or vomiting Anesthetic complications: no   No notable events documented.  Last Vitals:  Vitals:   11/27/23 1215 11/27/23 1230  BP:    Pulse: 64 60  Resp: 15 11  Temp:  36.4 C  SpO2: 99% 98%    Last Pain:  Vitals:   11/27/23 0906  TempSrc: Oral  PainSc: 0-No pain                 Butler Levander Pinal

## 2023-11-27 NOTE — OR Nursing (Signed)
 Voiding trial begins.  300cc sterin normal saline instilled into bladder via cathetor.  Cathetor balloon deflated and removed.  Patient assisted to restroom.  Patient was unable to void.  Dr. Marilynne notified.  Instructed to discharge patient to home with foley cathetor.  Andree Kerns rn

## 2023-11-28 ENCOUNTER — Encounter (HOSPITAL_COMMUNITY): Payer: Self-pay | Admitting: Obstetrics and Gynecology

## 2023-11-28 ENCOUNTER — Telehealth: Payer: Self-pay

## 2023-11-28 DIAGNOSIS — N952 Postmenopausal atrophic vaginitis: Secondary | ICD-10-CM

## 2023-11-28 NOTE — Progress Notes (Signed)
 Patient is requesting Estradiol  cream to help with surgical healing.

## 2023-11-28 NOTE — Telephone Encounter (Signed)
 Jarrett  underwent Anterior (cystocele) And Posterior Repair (rectocele) With Sacrospinous Fixation, Perineorrhaphy, and Cystoscopy  on 11/27/2023  with [] Dr Marilynne [] Dr Guadlupe.  The patient reports that her pain is controlled.  She is taking [] No Medication [] Acetaminophen  500mg  every 6 hours [] Ibuprofen  600mg  every 6 hours or []  Prescribed Narcotic.  Her pain level is 2[x] with medication [] Without medication is.   She reports vaginal bleeding.  The patient is tolerating PO fluids and solids. She has not had a bowel movement and is taking Miralax  for a bowel regimen. She is not passing gas.  She was discharged without a catheter.   [x]  Discharged without a catheter, the patient does feel as if she is emptying her bladder.  [] Discharged with a catheter, the patient is not having any concerns with her catheter.  She will return for a voiding trial. [] Verified scheduled date and time with patient.  She does not having any additional questions.  Reviewed Post operative instructions as needed to answer additional questions.   CC'd note to patient's provider.

## 2023-11-29 MED ORDER — ESTRADIOL 0.1 MG/GM VA CREA: TOPICAL_CREAM | VAGINAL | 11 refills | Status: AC

## 2023-11-29 NOTE — Addendum Note (Signed)
 Addended by: MARILYNNE ROSALINE SAILOR on: 11/29/2023 09:18 AM   Modules accepted: Orders

## 2023-11-29 NOTE — Telephone Encounter (Signed)
Estradiol ordered per patient request

## 2023-12-04 ENCOUNTER — Other Ambulatory Visit: Payer: Self-pay | Admitting: Internal Medicine

## 2023-12-04 DIAGNOSIS — Z1231 Encounter for screening mammogram for malignant neoplasm of breast: Secondary | ICD-10-CM

## 2023-12-06 NOTE — Telephone Encounter (Signed)
 Please see communication on the 11-28-2023 from [facility] CMA

## 2024-01-03 ENCOUNTER — Encounter

## 2024-01-03 ENCOUNTER — Encounter: Admitting: Obstetrics and Gynecology

## 2024-01-03 ENCOUNTER — Encounter: Payer: Self-pay | Admitting: Obstetrics and Gynecology

## 2024-01-03 ENCOUNTER — Ambulatory Visit (INDEPENDENT_AMBULATORY_CARE_PROVIDER_SITE_OTHER): Admitting: Obstetrics and Gynecology

## 2024-01-03 VITALS — BP 119/79 | HR 67

## 2024-01-03 DIAGNOSIS — Z9889 Other specified postprocedural states: Secondary | ICD-10-CM

## 2024-01-03 DIAGNOSIS — Z48816 Encounter for surgical aftercare following surgery on the genitourinary system: Secondary | ICD-10-CM

## 2024-01-03 DIAGNOSIS — Z1231 Encounter for screening mammogram for malignant neoplasm of breast: Secondary | ICD-10-CM

## 2024-01-03 NOTE — Progress Notes (Signed)
 [facility]  Date of Visit: 01/03/2024  History of Present Illness: [name] is a 53 y.o. female scheduled today for a post-operative visit.   Surgery: s/p Anterior and posterior repair with perineorrhaphy, sacrospinous ligament fixation, cystoscopy on 11/27/23  She passed her postoperative void trial.   Postoperative course has been uncomplicated.   Today she reports she has some irritation at the vaginal opening. She used vaginal estrogen but cause more irritation with more discharge. Has been improving. Sometimes has some urinary hesitance in the morning.   UTI in the last 6 weeks? No  Pain? No  She has returned to her normal activity (except for postop restrictions) Vaginal bulge? No  Stress incontinence: No  Urgency/frequency: No  Urge incontinence: No  Voiding dysfunction: some in the morning, ok during the day Bowel issues: No  but needing a stool softener  Subjective Success: Do you usually have a bulge or something falling out that you can see or feel in the vaginal area? No  Retreatment Success: Any retreatment with surgery or pessary for any compartment? No   Medications: She has a current medication list which includes the following prescription(s): acetaminophen , docusate sodium, escitalopram , estradiol , estradiol , hydrochlorothiazide , ibuprofen , levothyroxine , liothyronine, magnesium gluconate, misc natural products, multivitamin with minerals, polyethylene glycol powder, progesterone, propranolol  er, topiramate , turmeric, and OVER THE COUNTER MEDICATION.   Allergies: Patient is allergic to bacitracin, bacitracin-neomycin-polymyxin, bacitracin-polymyxin b, celebrex [celecoxib], vraylar  [cariprazine ], wellbutrin  xl [bupropion ], and neosporin [neomycin-bacitracin zn-polymyx].   Physical Exam: BP 119/79   Pulse 67   LMP 05/12/2010    Pelvic Examination: Perineal incision well healed but sutures still present. Vagina: Incisions healing well. Sutures are  present at the cuff. No tenderness along the anterior or posterior vagina. No apical tenderness. No pelvic masses.   POP-Q: POP-Q  -3                                            Aa   -3                                           Ba  -7.5                                              C   3                                            Gh  5.5                                            Pb  7.5                                            tvl   -3  Ap  -3                                            Bp                                                 D    ---------------------------------------------------------  Assessment and Plan:  1. Post-operative state     - Will reexamine in a month to ensure perineal healing. Recommended restarting vaginal estrogen twice a week at night.  - Can resume regular activity including exercise. Discussed avoidance of heavy lifting and straining long term to reduce the risk of recurrence. Await intercourse until after next visit.   All questions answered.   Return in about 1 month (around 02/02/2024).  Tiffany LOISE Caper, MD

## 2024-01-08 IMAGING — MG MM DIGITAL SCREENING BILAT W/ TOMO AND CAD
8 series · 9 of 24 positions shown · non-contrast
Comparison: Previous exam(s).

CLINICAL DATA: Screening.

EXAM:
DIGITAL SCREENING BILATERAL MAMMOGRAM WITH TOMOSYNTHESIS AND CAD
TECHNIQUE: Bilateral screening digital craniocaudal and mediolateral oblique
mammograms were obtained. Bilateral screening digital breast
tomosynthesis was performed. The images were evaluated with
computer-aided detection.

[R MLO synth-2D]
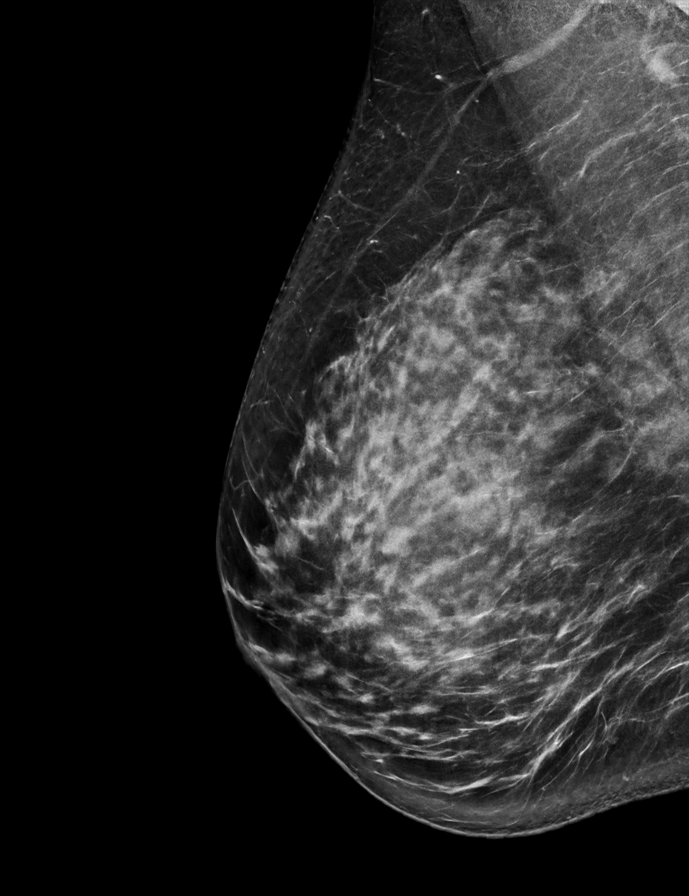

[R CC synth-2D]
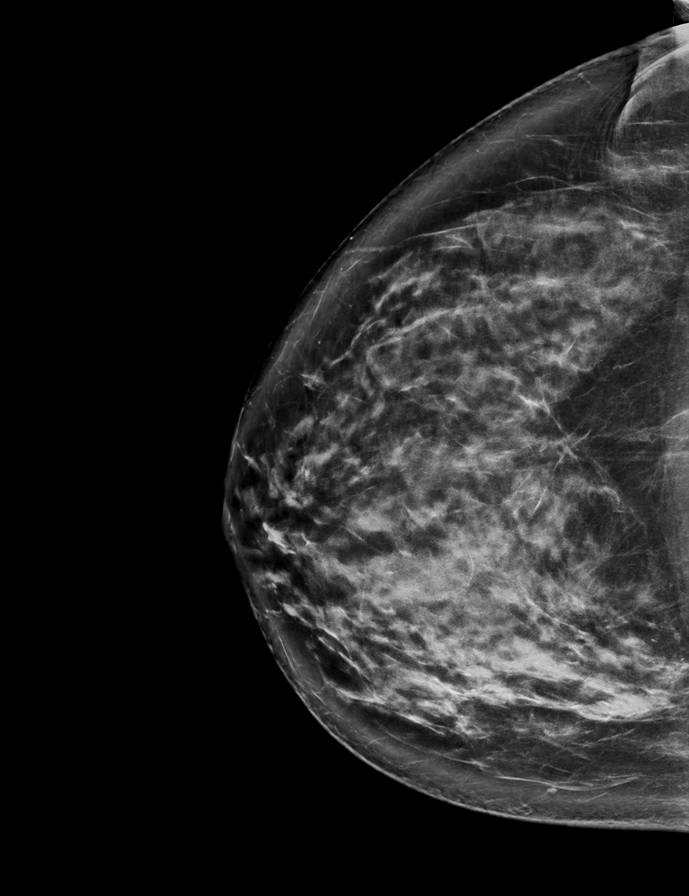

[L MLO synth-2D]
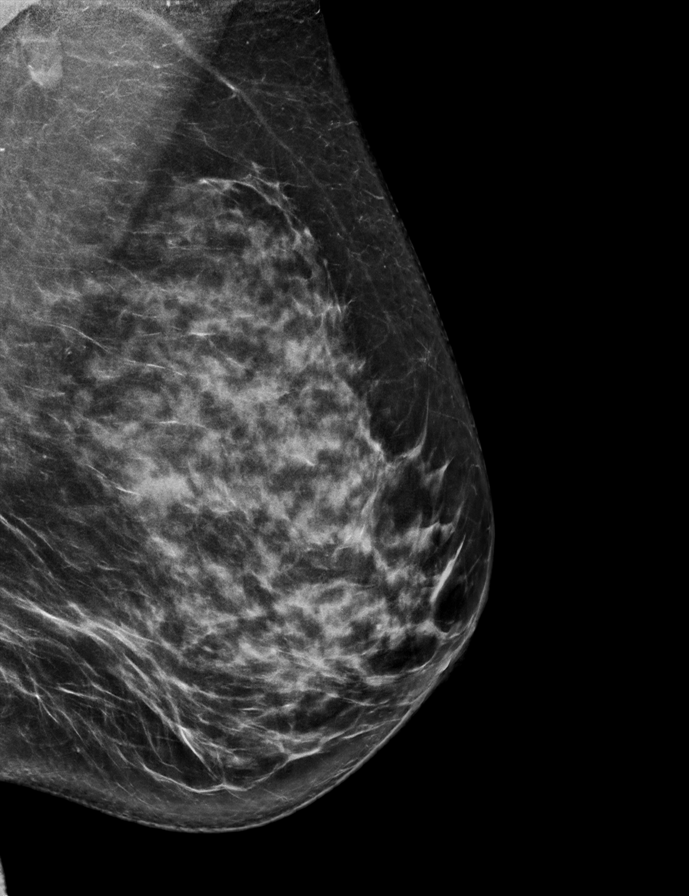

[L CC synth-2D]
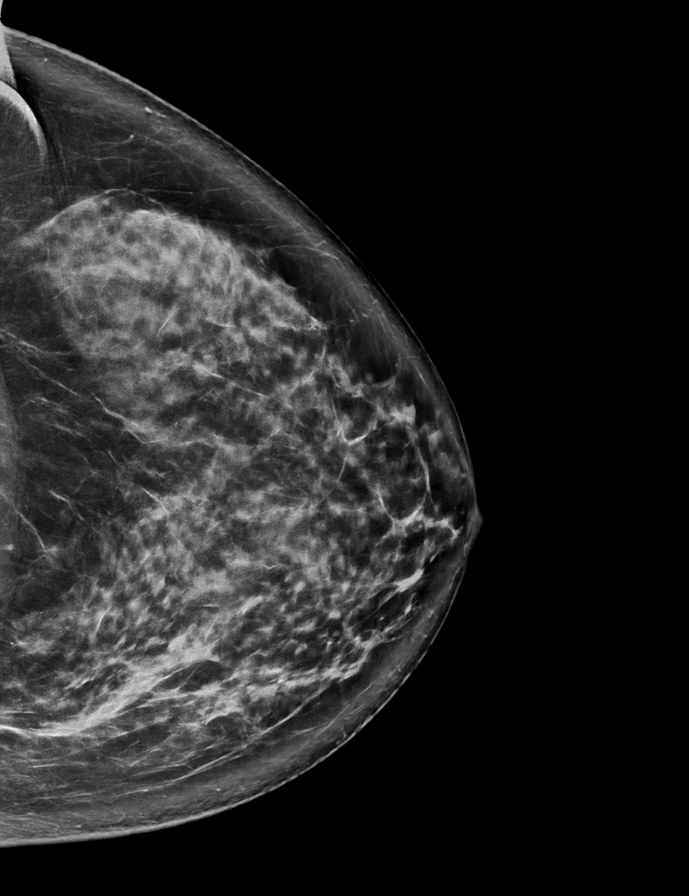

[R CC tomo · 2 of 88 frames shown]
[frame 29/88]
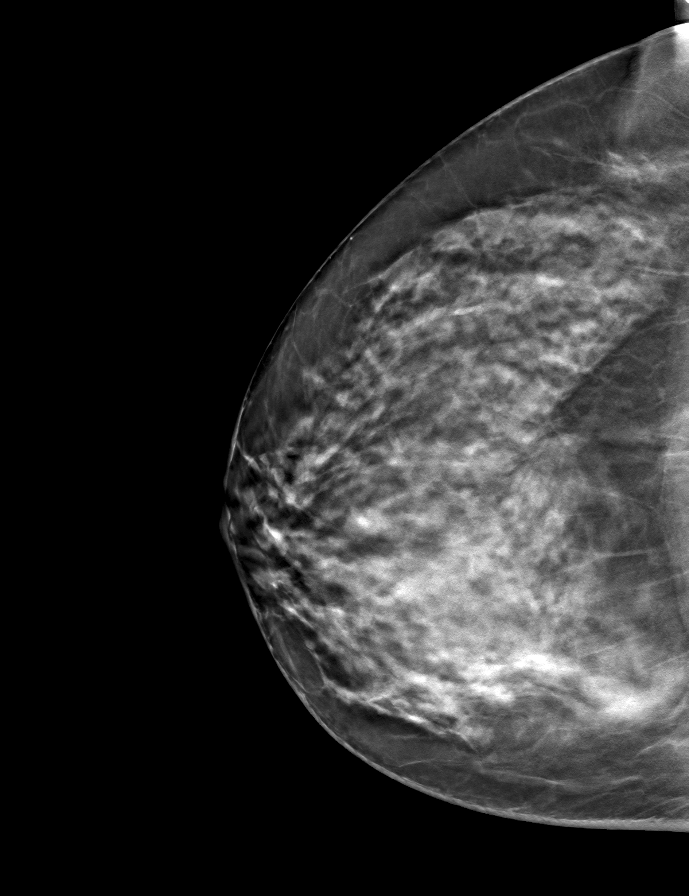
[frame 45/88]
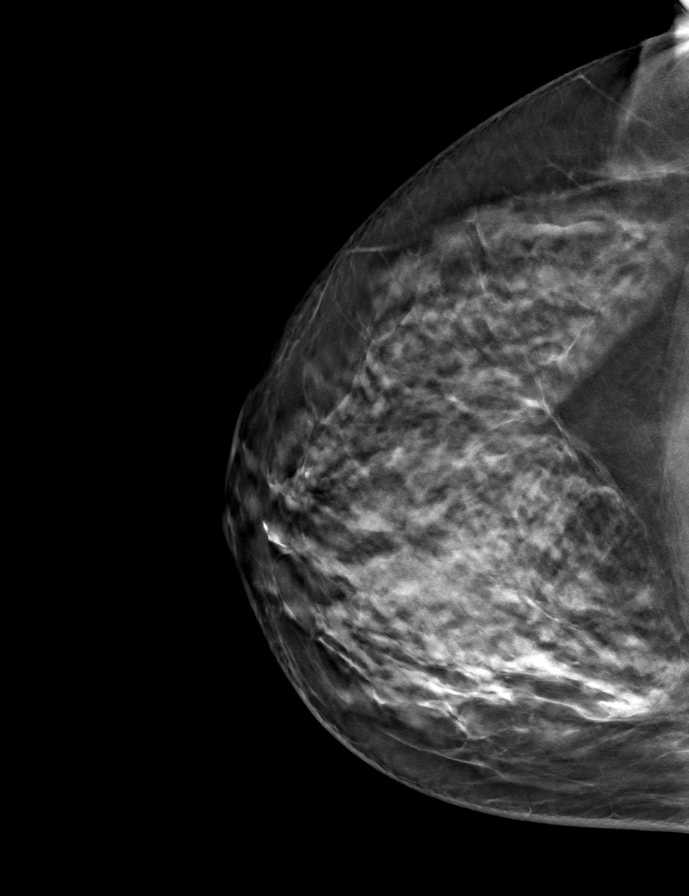

[L CC tomo · tomo slice 43/86.0]
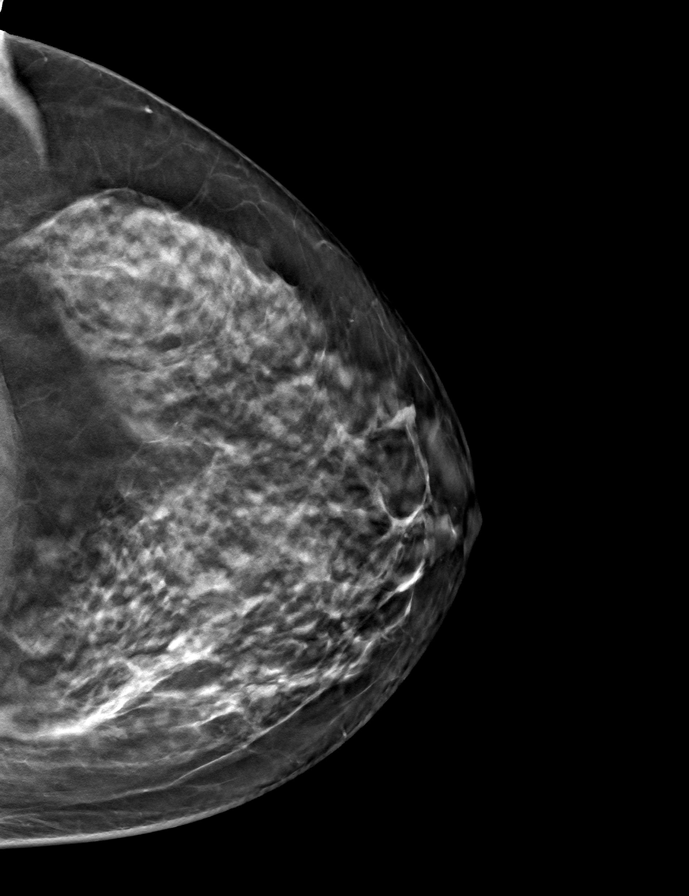

[R MLO tomo · tomo slice 41/82.0]
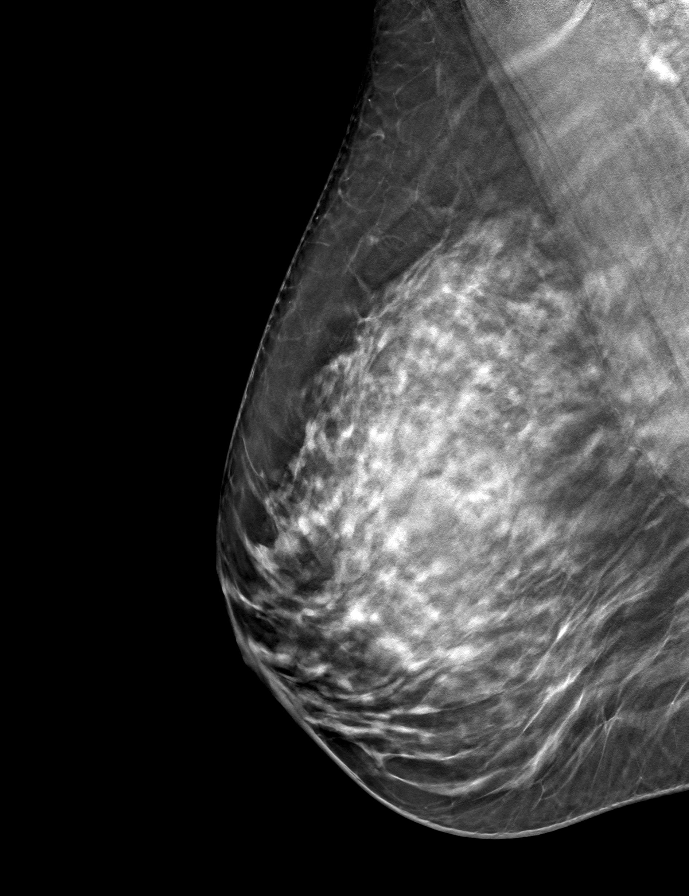

[L MLO tomo · tomo slice 41/81.0]
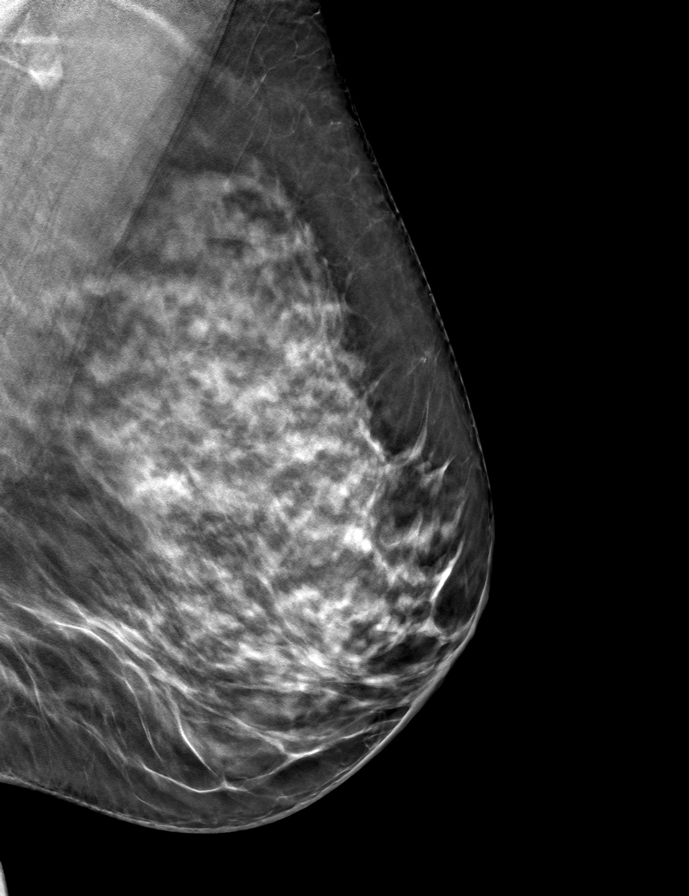

[9 of 24 positions shown; findings below may reference images not displayed]

ACR Breast Density Category c: The breast tissue is heterogeneously
dense, which may obscure small masses.
FINDINGS: There are no findings suspicious for malignancy.
IMPRESSION: No mammographic evidence of malignancy. A result letter of this
screening mammogram will be mailed directly to the patient.

RECOMMENDATION:
Screening mammogram in one year. (Code:Q3-W-BC3)

BI-RADS CATEGORY  1: Negative.

## 2024-01-15 ENCOUNTER — Telehealth: Payer: Self-pay

## 2024-01-15 NOTE — Telephone Encounter (Signed)
 Patient called stating she had surgery 10/06, she was working out and did a plank and

## 2024-01-26 ENCOUNTER — Telehealth: Payer: Self-pay | Admitting: Obstetrics and Gynecology

## 2024-01-26 NOTE — Telephone Encounter (Signed)
 Patient called and cancelled her post op.  Has new insurance and said it does not cover preexisting conditions.  She said she is doing well and that you did see her recently.

## 2024-02-02 ENCOUNTER — Encounter: Admitting: Obstetrics and Gynecology

## 2024-02-12 ENCOUNTER — Other Ambulatory Visit: Payer: Self-pay | Admitting: Internal Medicine

## 2024-02-12 DIAGNOSIS — Z1231 Encounter for screening mammogram for malignant neoplasm of breast: Secondary | ICD-10-CM

## 2024-03-04 ENCOUNTER — Encounter: Payer: Self-pay | Admitting: *Deleted

## 2024-03-12 ENCOUNTER — Ambulatory Visit
Admission: RE | Admit: 2024-03-12 | Discharge: 2024-03-12 | Disposition: A | Payer: PRIVATE HEALTH INSURANCE | Source: Ambulatory Visit

## 2024-03-12 DIAGNOSIS — Z1231 Encounter for screening mammogram for malignant neoplasm of breast: Secondary | ICD-10-CM

## 2024-04-08 ENCOUNTER — Ambulatory Visit: Payer: Self-pay | Admitting: Podiatry
